# Patient Record
Sex: Female | Born: 1935 | Race: White | Hispanic: No | Smoking: Former smoker
Health system: Southern US, Community
[De-identification: ages and names within clinical notes are randomized; demographics above are authoritative.]

## PROBLEM LIST (undated history)

## (undated) DIAGNOSIS — F32A Depression, unspecified: Secondary | ICD-10-CM

## (undated) DIAGNOSIS — J302 Other seasonal allergic rhinitis: Secondary | ICD-10-CM

## (undated) DIAGNOSIS — J449 Chronic obstructive pulmonary disease, unspecified: Secondary | ICD-10-CM

## (undated) DIAGNOSIS — M810 Age-related osteoporosis without current pathological fracture: Secondary | ICD-10-CM

## (undated) DIAGNOSIS — G2581 Restless legs syndrome: Secondary | ICD-10-CM

## (undated) DIAGNOSIS — J45909 Unspecified asthma, uncomplicated: Secondary | ICD-10-CM

## (undated) DIAGNOSIS — F329 Major depressive disorder, single episode, unspecified: Secondary | ICD-10-CM

## (undated) DIAGNOSIS — E039 Hypothyroidism, unspecified: Secondary | ICD-10-CM

## (undated) DIAGNOSIS — I4891 Unspecified atrial fibrillation: Secondary | ICD-10-CM

## (undated) DIAGNOSIS — K219 Gastro-esophageal reflux disease without esophagitis: Secondary | ICD-10-CM

## (undated) DIAGNOSIS — E785 Hyperlipidemia, unspecified: Secondary | ICD-10-CM

## (undated) DIAGNOSIS — E0590 Thyrotoxicosis, unspecified without thyrotoxic crisis or storm: Secondary | ICD-10-CM

## (undated) HISTORY — DX: Hyperlipidemia, unspecified: E78.5

## (undated) HISTORY — DX: Hypothyroidism, unspecified: E03.9

## (undated) HISTORY — PX: TOTAL HIP ARTHROPLASTY: SHX124

## (undated) HISTORY — PX: TOTAL KNEE ARTHROPLASTY: SHX125

## (undated) HISTORY — PX: JOINT REPLACEMENT: SHX530

## (undated) HISTORY — DX: Gastro-esophageal reflux disease without esophagitis: K21.9

## (undated) HISTORY — DX: Other seasonal allergic rhinitis: J30.2

## (undated) HISTORY — DX: Depression, unspecified: F32.A

## (undated) HISTORY — DX: Major depressive disorder, single episode, unspecified: F32.9

## (undated) HISTORY — DX: Unspecified asthma, uncomplicated: J45.909

## (undated) HISTORY — DX: Age-related osteoporosis without current pathological fracture: M81.0

## (undated) HISTORY — DX: Restless legs syndrome: G25.81

## (undated) HISTORY — DX: Chronic obstructive pulmonary disease, unspecified: J44.9

---

## 1968-10-13 HISTORY — PX: THYROIDECTOMY: SHX17

## 1968-10-13 HISTORY — PX: BREAST CYST EXCISION: SHX579

## 1990-10-13 HISTORY — PX: VAGINAL HYSTERECTOMY: SHX2639

## 1993-10-13 HISTORY — PX: OTHER SURGICAL HISTORY: SHX169

## 2001-05-13 HISTORY — PX: BUNIONECTOMY: SHX129

## 2001-10-13 HISTORY — PX: OTHER SURGICAL HISTORY: SHX169

## 2005-08-29 ENCOUNTER — Ambulatory Visit: Payer: Self-pay | Admitting: Physical Medicine & Rehabilitation

## 2005-08-29 ENCOUNTER — Inpatient Hospital Stay (HOSPITAL_COMMUNITY): Admission: RE | Admit: 2005-08-29 | Discharge: 2005-09-02 | Payer: Self-pay | Admitting: Orthopedic Surgery

## 2006-07-01 ENCOUNTER — Inpatient Hospital Stay (HOSPITAL_COMMUNITY): Admission: RE | Admit: 2006-07-01 | Discharge: 2006-07-05 | Payer: Self-pay | Admitting: Orthopedic Surgery

## 2006-11-27 ENCOUNTER — Inpatient Hospital Stay (HOSPITAL_COMMUNITY): Admission: RE | Admit: 2006-11-27 | Discharge: 2006-11-30 | Payer: Self-pay | Admitting: Orthopedic Surgery

## 2010-02-06 ENCOUNTER — Encounter: Admission: RE | Admit: 2010-02-06 | Discharge: 2010-02-06 | Payer: Self-pay | Admitting: Family Medicine

## 2011-02-28 NOTE — Discharge Summary (Signed)
NAMECATLIN, DORIA               ACCOUNT NO.:  [id number]   MEDICAL RECORD NO.:  [id number]          PATIENT TYPE:  INP   LOCATION:  5022                         FACILITY:  [facility]   PHYSICIAN:  Harvie Junior, M.D.   DATE OF BIRTH:  [birth year]   DATE OF ADMISSION:  11/27/2006  DATE OF DISCHARGE:  11/30/2006                               DISCHARGE SUMMARY   ADMITTING DIAGNOSES:  1. End-stage degenerative joint disease, left knee.  2. Hypothyroidism.  3. Hypertension.  4. Status post left total hip replacement.  5. Status post right total knee replacement.   DISCHARGE DIAGNOSES:  1. End-stage degenerative joint disease, left knee.  2. Hypothyroidism.  3. Hypertension.  4. Status post left total hip replacement.  5. Status post right total knee replacement.  6. Discharge postop blood loss anemia.  7. Urinary tract infection.   ALLERGIES:  1. CODEINE.  2. MORPHINE.   PROCEDURES IN THE HOSPITAL:  Left total knee arthroplasty, computer  assisted, Jodi Geralds, MD, November 27, 2006.   BRIEF HISTORY:  [name] is an 77 year old patient who is well known  to [place] who has a long history of left knee pain.  She has pain with  weight bearing and pain with ambulation, as well as night pain.  X-rays  of the left shows bone on bone degenerative arthritis.  She only got  temporary relief with cortisone injections and based on her clinical and  radiographical findings, she is felt to be a candidate for a left total  knee arthroplasty and is admitted for this.   PERTINENT LABORATORY STUDIES:  Her EKG, on admission, showed normal  sinus rhythm.  Hemoglobin, on admission, was 10.0 and hematocrit 32.3.  On postop day number 1, her hemoglobin was 7.7.  Postop day number 2,  her hemoglobin was 7.0.  Postop day number 3, hemoglobin was 9.7 with a  hematocrit of 29.3.  Pro time, on admission, was 14.2 seconds with an  INR of 1.1.  On the day of discharge, on Coumadin, her pro time was 17.7  seconds with an INR of 1.4.  CMET, on admission, showed no abnormalities  other than a slightly decreased albumin at 3.1.  BMET, on postop day  number 1, was within normal limits, other than a slightly elevated  glucose at 138.  Urinalysis, on admission, showed many bacteria with  calcium oscillate crystals noted.  Two units of packed RBCs were  transfused during this hospitalization.   HOSPITAL COURSE:  Patient was brought to the operating room.  Preoperatively, she was given 80 mg of IV gentamicin and 1 gram of  Ancef.  Postoperatively, she was given 1 gram of Ancef q.8 hours x3  doses.  She was started on the PCA morphine pump for pain control and  was seen by physical therapy for walker, ambulation, weight bearing as  tolerated on the left.  CPM machine was used for range of motion of the  left knee, and she was started on Coumadin antithrombolytic therapy per  pharmacy protocol.  On postop day number 1, she overall was doing well.  Had appropriate left knee pain.  She did have a decreased hemoglobin, at  that point, of 7.7, but was not complaining of dizziness at all.  We got  her CBC the following morning.  On postop day number 2, she had vital  signs that were stable.  She had a hemoglobin of 7.0.  Her dressing was  changed to the left knee and her drain was discontinued.  She is to  continue with physical therapy and occupational therapy as well.  Two  units of packed RBCs were transfused.  Her IV was converted to a saline  lock.  On postop day number 3, she stated she was ready to go home.  She  is taking fluids and voiding without difficulty.  She is progressing  well with physical therapy.  She was afebrile and vital signs were  stable.  Her left knee wound was benign.  Her calf was soft and  neurovascularly intact distally.  Her hemoglobin was 9.7.  Her INR was  1.4.  She was discharged home after being seen by the therapist that  day.  She was instructed to ambulate, weight  bearing as tolerated on the  left with a walker.  She was improved condition, was on a regular diet.  She was given an Rx for Percocet 5 mg 1 q.6 hours p.r.n. pain and  Coumadin x1 month postop to take as directed.   ADDITIONAL MEDICATIONS AT THE TIME OF DISCHARGE:  Included:  1. Requip 0.5 mg 1 p.o. q.h.s.  2. Cymbalta 60 mg 1 daily.  3. Synthroid 125 mcg 1 daily.  4. Atenolol 100 mg p.o. daily.  5. Ventolin 90 mg b.i.d.  6. Astelin 30 nasal spray p.r.n.  7. Vitamin D 500 mg daily.  8. Calcium 1 tablet daily.   She will need home health physical therapy 3 times a week and home  health RN for pro time and Coumadin management and a home CPM machine  for range of motion.  She is to follow up with Dr. Luiz Blare in 10-14 days,  call sooner if any problems occur.      Marshia Ly, P.A.      Harvie Junior, M.D.  Electronically Signed    JB/MEDQ  D:  01/27/2007  T:  01/27/2007  Job:  [id number]

## 2011-02-28 NOTE — Discharge Summary (Signed)
[name], [name]               ACCOUNT NO.:  [id number]   MEDICAL RECORD NO.:  [id number]          PATIENT TYPE:  INP   LOCATION:  5004                         FACILITY:  [facility]   PHYSICIAN:  Harvie Junior, M.D.   DATE OF BIRTH:  1922/01/07   DATE OF ADMISSION:  08/29/2005  DATE OF DISCHARGE:  09/02/2005                                 DISCHARGE SUMMARY   ADMISSION DIAGNOSES:  1.  End-stage degenerative joint disease left hip.  2.  Hypertension.  3.  Hypothyroidism.  4.  Gastroesophageal reflux disease.  5.  History of tobacco usage, stopped eight years ago.   DISCHARGE DIAGNOSES:  1.  End-stage degenerative joint disease left hip.  2.  Hypertension.  3.  Hypothyroidism.  4.  Gastroesophageal reflux disease.  5.  History of tobacco usage, stopped eight years ago.  6.  Postoperative blood loss anemia.   ALLERGIES:  CODEINE.   PROCEDURES IN HOSPITAL:  Left total hip arthroplasty, Harvie Junior, M.D.,  on August 29, 2005.   BRIEF HISTORY:  Kelsey [name] is an 77 year old pleasant female who has a long  history of left hip pain and stiffness. Her pain has progressively worsened  to the point where she has night pain and pain with ambulation. She is  having difficulty ambulating because of hip pain and limited motion in her  left hip. Plain x-rays of the pelvis and left hip show that she has severe  bone-on-bone arthritis of the left hip. Based on her radiographic and  clinical findings, she is felt to be a candidate for a left total hip  arthroplasty and is admitted for this.   PERTINENT LABORATORY FINDINGS:  Postoperative x-ray of the left hip show  anatomic alignment, status post left hip arthroplasty without acute  complicating features. Hemoglobin on admission was 10.0, hematocrit 31.4,  white blood cell count 8.9, hemoglobin on postoperative day #1 was 7.9. Two  units of packed red blood cells were transfused and on postoperative day #2  her hemoglobin was 10.6, on  postoperative day #3 was 10.5. Indices within  normal limits. Protime on admission was 13.1 seconds, INR of 1.0, and PTT  29. On the day of discharge the patient's protime was 19.7 seconds with an  INR of 1.7, on Coumadin. CMET on admission showed no abnormalities other  than a slightly decreased albumin at 3.4.  On postoperative day #1 her  sodium was 134, glucose 141, calcium 8.2. Urinalysis on admission showed  rare bacteria with 7-10 wbc's per high power field.  Preoperative urine  culture showed 50,000 colonies, multiple bacterial morphotype present, none  predominant.   HOSPITAL COURSE:  The patient underwent a left total hip arthroplasty as  well described in Dr. Luiz Blare' operative note on August 29, 2005.  Preoperatively, she is given 80 mg of gentamycin and 1 gm of Ancef. She did  have a few white blood cells in her urine preoperatively and it was felt  that these antibiotics would take care of this. Surgery went without  complications as well as described in Dr. Luiz Blare' operative note.  Postoperatively  she was put on Ancef 1 gm IV q.8h. times five doses. She was  started on Coumadin therapy for DVT prophylaxis and IV pain medication. On  postoperative day #1, she is resting comfortably, afebrile, vital signs  stable, and her hemoglobin was 7.9.  She had a little bit dizziness. Her INR  was 1.1. Two units of packed RBCs were transfused. She has got out of bed  with physical therapy, weight bearing as tolerated on the left.  On  postoperative day #2, she was overall doing well, had a low grade fever of  100.8, and was then found to be afebrile. Her INR was 1.3.  Her hemoglobin  was up to 10.6.  Total hip precautions were instituted. Her PTCA was  discontinued and her IV was converted to a Hep lock. Her dressing was  changed. She had a little  nausea the following day. On postoperative day #3  she was without complaints. Hemoglobin was stable at 10.5, INR 1.4. She  continued with  home health physical therapy. On postoperative day #4, she  was doing well, she was ready to go home, she was taking p.o. and voiding  without difficulty. She was ambulating in the hall with therapy. Vital signs  were stable and afebrile. Her left hip wound had some mild serous drainage  with no sign of infection. Her INR was 1.7 and her hemoglobin was 10.5.  Dressing was changed on her left hip. She was discharged home in improved  condition. She was on a regular diet. She was given extra Vicodin 5 mg one  to two q.6h. p.r.n. pain, Coumadin times one month postoperative for DVT  prophylaxis, and Keflex 500 mg b.i.d. times five days. Activity status will  be weightbearing as tolerated on the left with a walker. She will need home  health Coumadin three times a week and home Coumadin management times one  month postoperatively. She will follow up with Dr. Luiz Blare in the office in  two weeks.      Kelsey [name], P.A.      Harvie Junior, M.D.  Electronically Signed    JB/MEDQ  D:  10/29/2005  T:  10/29/2005  Job:  [id number]

## 2011-02-28 NOTE — Op Note (Signed)
NAMEANANIAH, MACIOLEK               ACCOUNT NO.:  [id number]   MEDICAL RECORD NO.:  [id number]          PATIENT TYPE:  INP   LOCATION:  5011                         FACILITY:  [facility]   PHYSICIAN:  Harvie Junior, M.D.   DATE OF BIRTH:  Jan 20, 1922   DATE OF PROCEDURE:  07/01/2006  DATE OF DISCHARGE:  07/05/2006                                 OPERATIVE REPORT   DATE OF REPEAT LOST DICTATION:  July 15, 2006.   OPERATIVE PROCEDURES:  1. Right total knee replacement with Sigma system  2. Computer-assisted right total knee replacement.   SURGEON:  Harvie Junior, M.D.   Threasa HeadsOrma Flaming.   ANESTHESIA:  General.   BRIEF HISTORY:  Ms. Corter is an 77 year old female with long history of  having had left total hip replacement.  She has done with that.  Because of  continued complaints of unrelenting right knee pain with all failure of all  conservative care, she was taken to the operating room for a total knee  replacement.  The patient was noted to have some significant valgus  preoperative alignment.  It was felt that computer assistance would be  appropriate to try to get good neutral long alignment for her.  She was  taken to the operating room for these procedures.   PROCEDURE:  The patient was taken to the operating room.  After adequate  anesthesia was obtained with general endotracheal anesthesia, the patient  was placed supine on the operating room table.  The right leg was prepped  and draped in the usual sterile fashion.  Following this, a midline incision  was made.  Subcutaneous tissue dissected down to the level of the extensor  mechanism.  Medial parapatellar arthrotomy was undertaken and medial and  lateral meniscectomy followed by anterior and posterior cruciate excision  were undertaken, followed by excision of retropatellar fat pad.  At this  point the computer assistance modules were placed.  It takes about 20  minutes to get these in place and the  registration of the knee undertaken.  Following this, with computer assistance, the tibia was cut perpendicular to  its long axis.  The tensiometer was then used to balance the flexion-  extension and a plan was outlined for the case.  At that point, the distal  femur was cut perpendicular to its long axis under computer assistance, and  then the gaps were checked with spacer blocks.  At that point, the final  sizing was undertaken, and the final sizing was checked.  Anterior and  posterior cuts were then made.  The flexion gap was then checked to balance  the extension gap, and this was as per computer assistance, essentially dead  on.  Her long alignment had a significant valgus alignment initially but we  were able to release this prior to the tensioning of the case to allow [place] to  get excellent long alignment.  At this point, chamfer cuts were made, box  cuts made, and then the trial femur was put in place.  The tibia was  incised.  A punch for the  keel is made, and the trial size put in place.  The patella was then cut and a trial patella was put in place.  Excellent  range of motion, perfect neutral long alignment, perfect gap balancing was  achieved with the computer assistance.  At this point the knee was copiously  irrigated and suctioned dry.  The trial components were removed.  Pulsatile  lavage irrigation was used.  The final components were then cemented in  place.  Computer assistance modules were still in alignment, and again  showed that we did have perfect neutral long alignment, perfect gap  balancing.   At this point, after the cement had dried and all excess cement had been  removed, the medial parapatellar arthrotomy was closed with 1 Vicryl running  suture after placement of a single medium Hemovac drain.  Sterile  compressive dressing was then applied after closure of the skin with 0 and 2-  0 Vicryl and skin staples.  After the sterile compressive dressing was   applied, a knee immobilizer was placed, and patient was taken to the  recovery room.  She was noted to be in a satisfactory condition.   ESTIMATED BLOOD LOSS OF PROCEDURE:  Minimal _____.      Harvie Junior, M.D.  Electronically Signed     JLG/MEDQ  D:  07/15/2006  T:  07/16/2006  Job:  [id number]

## 2011-02-28 NOTE — Op Note (Signed)
NAMEMARESHA, Kelsey [name]               ACCOUNT NO.:  [id number]   MEDICAL RECORD NO.:  [id number]          PATIENT TYPE:  INP   LOCATION:  2899                         FACILITY:  [facility]   PHYSICIAN:  Harvie Junior, M.D.   DATE OF BIRTH:  1922/07/26   DATE OF PROCEDURE:  08/29/2005  DATE OF DISCHARGE:                                 OPERATIVE REPORT   PREOPERATIVE DIAGNOSIS:  Painful left hip with end-stage degenerative joint  disease.   POSTOPERATIVE DIAGNOSIS:  Painful left hip with end-stage degenerative joint  disease.   PROCEDURE:  Left total hip replacement with a Summit cemented stem, high  offset, size 5 with a +5 ball, a 54 mm Pinnacle cup with a 32 mm poly 10  degree hooded liner.   SURGEON:  Harvie Junior, M.D.   ASSISTANT:  Marshia Ly, P.A.   ANESTHESIA:  General anesthesia.   BRIEF HISTORY:  Ms. [name] is an 77 year old long female with a long  history of having had significant left hip pain.  She ultimately had been  evaluated in our office multiple times and x-ray sequentially showed the  absolute loss of left joint space and she was having significant groin pain  and lateral hip pain.  She basically was requesting a total hip replacement  on evaluation and because of her x-rays that showed this degenerative  change, we felt this was the most appropriate course of action, she was  brought to the operating room for total hip replacement.   DESCRIPTION OF PROCEDURE:  Patient brought to the operating room and after  adequate anesthesia was obtained under general anesthetic, the patient was  brought to the operating room and placed on the operating table.  Left leg  was prepped and draped in usual sterile fashion.  Following this, the  incision was made for posterior approach to the hip.  Subcutaneous tissue  dissected down to the level of the tensor fascia which was divided in line  with its fibers.  The gluteus maximus was finger fractured.  The retractor  was  then put in place underneath the piriformis and inferiorly around the  neck.  The piriformis was then tagged.  The posterior capsule was then  tagged with two Ethibond interrupted sutures and the hip was then  dislocated.  Provisional neck cut was made at this point with a guide.  Following this, the canal was reamed and then broached to a size 5.  The  size 5 broach did go down well but there was some tightness getting it down  impaled to push this.  We trialed with a size 5 broach.  Original plan was a  6 which is 2 mm longer than a 5.  We thought we had a good low neck cut so  we trialed the +0 ball initially with a high offset stem and then felt that  the probably +5 gave [place] greater stability and then given her age and issues,  I think that stability was more important so we used a +5 ball.  At this  point, trial range of motion showed  excellent stability and range of motion.  The wound was then copiously irrigated and suctioned dry.  The trial was  removed.  The final cup was placed into an acetabulum which had been  sequentially reamed to a level of 53 and a 54 cut was placed.  The whole  eliminator was now used and the trial poly was removed and the final poly  put in place.  The stem was then cemented into place and once the cement was  allowed to harden, trial reductions were undertaken with the +0 and +5 ball.  Both seemed reasonable but it just felt like we got a little bit of extra  stability lack of shuck with a +5 ball and we felt that even though there is  some potential issues with leg length that stability would be the more  important issue as we discussed with the patient preoperatively and at this  point, when this was chosen, the final ball was put in place.  Final  reduction was undertaken.  The short external rotators and posterior capsule  were repaired posteriorly and this allowed excellent repair posteriorly.  At  this point, hip was copiously irrigated, suctioned  dry.  The tensor fascia  was closed with +1 Vicryl running suture, the skin with 0 and 2-0 Vicryl and  then skin staples.  A sterile compressive dressing was applied and the  patient taken to the recovery room where she was noted to be in satisfactory  condition.   ESTIMATED BLOOD LOSS:  About 100 mL.      Harvie Junior, M.D.  Electronically Signed     JLG/MEDQ  D:  08/29/2005  T:  08/30/2005  Job:  [id number]

## 2011-02-28 NOTE — Discharge Summary (Signed)
Kelsey [name], Kelsey [name]               ACCOUNT NO.:  [id number]   MEDICAL RECORD NO.:  [id number]          PATIENT TYPE:  INP   LOCATION:  5011                         FACILITY:  [facility]   PHYSICIAN:  Harvie Junior, M.D.   DATE OF BIRTH:  05-31-1922   DATE OF ADMISSION:  07/01/2006  DATE OF DISCHARGE:  07/05/2006                                 DISCHARGE SUMMARY   ADMISSION DIAGNOSES:  1. End-stage degenerative joint disease, right knee.  2. Hypertension.  3. Chronic obstructive pulmonary disease.  4. Status post left total hip arthroplasty, November 2006.  5. History of tobacco dependency in the remote past.   DISCHARGE DIAGNOSES:  1. End-stage degenerative joint disease, right knee.  2. Hypertension.  3. Chronic obstructive pulmonary disease.  4. Status post left total hip arthroplasty, November 2006.  5. History of tobacco dependency in the remote past.  6. Hypothyroidism.  7. Esophageal reflux.  8. Cardiomegaly.   PROCEDURES IN HOSPITAL:  Right total knee arthroplasty, computer assisted,  Jodi Geralds, M.D., July 01, 2006.   BRIEF HISTORY:  Kelsey [name] is an 77 year old patient who is well known to  [place] with a long history of right knee pain.  She has night pain, and pain on  ambulation.  Her x-rays showed bone-on-bone degenerative arthritis of the  right knee.  She did not improve with exhaustive conservative treatment, and  was admitted for a right total knee replacement.   PERTINENT LABORATORY STUDIES:  Hemoglobin on admission was 14.1, hematocrit  42.3.  Postoperative day number 1, hemoglobin 7.4; number 2, 8.8; number 3,  9.0.  Pro time on admission was 13 seconds, with an INR 1.0, PTT was 30.  On  the date of discharge, her pro time was 26.6 seconds, with an INR of 2.3 on  Coumadin.  CMET on admission was essentially within normal limits.  BMET on  postoperative day number 1 and 2 was also within normal limits.  Urinalysis  showed no abnormalities.   HOSPITAL  COURSE:  The patient underwent right total knee replacement,  computer assisted, as was described in Dr. Luiz Blare operative note on  July 01, 2006.  She was put on IV antibiotics preoperatively as well as  postoperatively.  This being Ancef 1 gram and gentamycin given  preoperatively.  She was also started on Coumadin per pharmacy protocol for  DVT prophylaxis the night of surgery.  CPM machine was used for right knee  range of motion, and physical therapy was ordered for walker ambulation,  weightbearing as tolerated on the right.  On postoperative day number 1, she  had moderate knee pain.  Her hemoglobin was stable at 10.4.  INR was 1.1.  She was alert and oriented.  She had a Hemovac drain which was intact; and a  Foley catheter had been placed at the time of the surgery, and was intact.  She has gotten out of bed to the chair with physical therapy.  She ambulated  with a rolling walker.   On postoperative day number 2, she had some muscle spasms in her legs.  She  was taking fluids without difficulty.  Her Foley catheter had been removed.  Her hemoglobin was 8.8, and her INR was 1.9.  Her dressing was changed, and  her Hemovac drain was pulled.  Her PCA Dilaudid was discontinued.  Her IV  was converted to a saline lock.  She was given Robaxin for her leg spasm.   On postoperative day number 3, she was alert and oriented.  She had some  muscle spasms, but her pain was well controlled.  Her hemoglobin was 9.  The  patient was continued with physical therapy.  On 07/05/06, postoperative day  number 4, the patient was comfortable.  She was ready to go home.  She had  no shortness of breath or chest pain.  Her vitals signs were stable.  She  was afebrile.  The incision was clean and dry, and appears intact distally.  She was discharged home in improved condition.  She will be weightbearing as  tolerated on the right, ambulating with a walker.  She will use CPM per  protocol.  She was  given a prescription for Vicodin p.r.n. for pain,  Coumadin per pharmacy protocol x1 month postoperative, and Robaxin 500 mg  p.r.n. spasm.  She will be on a regular diet.  She will continue to wear her  knee-high TED hose on both lower extremities.  She will follow up with Dr.  Luiz Blare in two weeks in the office.      Marshia Ly, P.A.      Harvie Junior, M.D.  Electronically Signed    JB/MEDQ  D:  08/19/2006  T:  08/20/2006  Job:  [id number]

## 2011-02-28 NOTE — Op Note (Signed)
NAMESHWETA, AMAN               ACCOUNT NO.:  [id number]   MEDICAL RECORD NO.:  [id number]          PATIENT TYPE:  INP   LOCATION:  5022                         FACILITY:  [facility]   PHYSICIAN:  Harvie Junior, M.D.   DATE OF BIRTH:  December 14, 1921   DATE OF PROCEDURE:  11/27/2006  DATE OF DISCHARGE:                               OPERATIVE REPORT   PREOPERATIVE DIAGNOSES:  End-stage degenerative joint disease, left  knee, with significant valgus alignment.   POSTOPERATIVE DIAGNOSES:  End-stage degenerative joint disease, left  knee, with significant valgus alignment.   PRINCIPAL PROCEDURE:  1. Left total knee replacement with Sigma System size 3 femur, size 3      tibia, 10-mm bridging bearing, and a 35-mm all polyethylene      patella.  2. Computer-assisted total knee replacement, left.   SURGEON:  Jodi Geralds, MD   ASSISTANT:  Marshia Ly, PA   ANESTHESIA:  General.   BRIEF HISTORY:  Ms. Klinger is an 77 year old female with a long history  of having had severe bilateral knee disease.  She also had bilateral hip  disease.  She had a left total hip replacement.  She had a right total  knee replacement and was coming now for left total knee replacement  because of severe unrelenting disease.  She had a severe valgus  alignment preoperatively and because of a hip replacement, we felt that  most appropriate here was going to be computer-assisted alignment to try  to get her into neutral long alignment and this was preplanned and  brought into the operating room with [place].   PROCEDURE:  The patient was brought to the operating room and after  adequate anesthesia was obtained under general anesthesia, the patient  was placed supine on the operating table.  The left knee was then  prepped and draped in the usual sterile fashion.  The curved incision  was made to follow her valgus malalignment and subcutaneous tissue was  taken down to the level of the extensor mechanism.  A  medial  parapatellar arthrotomy was undertaken as well as a medial and lateral  meniscectomy, posterior and anterior cruciate excision, and  retropatellar fat pad excision.  At this point, attention was turned  towards placement of the computer modules, two pins in the tibia, two  pins in the femur, and then the rays were placed.  Registration process  undertaken.  It takes about twenty minutes to do all of this.  Once this  registration process was undertaken, we basically looked at her long  alignment valgus.  She was in seven degrees of valgus alignment and we  did lateral releases of the iliotibial band and the posterolateral  capsular structures.  Once this was completed, attention was turned  toward the computer access to cut the tibia perpendicular to its long  access.  Tensiometer was then placed.  We came to the planning screen.  The planning screen was moving the joint line and not having balanced  gaps in the way we felt was appropriate.  At this point, we cut more  tibia; two  more mm of tibia were cut and then I rebalanced with the  computer assistance at that point and we got an excellent plan to  balance the gaps in flexion and extension with essentially a neutral  joint line.  At this point, attention was turned towards cutting the  distal femur.  This was cut perpendicular to the long axis of the leg  and once this was completed, I used spacer blocks just to check the gaps  and the gaps felt perfect with perfect neutral long alignment.  At this  point, a size 3 femur had been chosen.  The anterior and posterior cuts  were made as well as the chamfer cuts and then the box was cut.  The  tibia was then sized, keeled, and drilled for a size 3 tibia.  The 10-mm  bridging bearing was then put in place and with a perfect neutral long  alignment, perfect gap balance, and perfect rotation with no limits to  knee flexion at this point.  The patella was then cut with a guide and   the patellar button was chosen, 35-mm all polyethylene patella, and the  trial was put in place after the lugs were drilled for this and  excellent long alignment, no instability.  At this point, all of the  trials were removed.  The knee was copiously and thoroughly irrigated  and suctioned dry.  The final components were then opened, identified,  and after cement was mixed, these were all cemented into place, size 3  femur, size 3 tibia, 10-mm bridging bearing trial was not opened, and a  35-mm patella was cemented into place.  We put the trial implant in  place and allowed the cement to harden.  Then we looked to make sure  there was no cement in the back.  There was not.  Then we let the  tourniquet down to make sure there was no significant bleeding.  None  was encountered that was not controllable with electrocautery.  At this  point, the final polyethylene was put in place and then the knee was  begun to be put through a range of motion with excellent stability.  Medium Hemovac drain was placed.  Medial parapatellar arthrotomy was  closed with a #1 Vicryl running suture, skin with #0 and 2-0 Vicryl and  skin staples.  Sterile compressive dressing was applied.  The patient  was taken to the recovery room where she was noted to be in satisfactory  condition.   ESTIMATED BLOOD LOSS:  For procedure was less than 50 mL when the  tourniquet was let down.      Harvie Junior, M.D.  Electronically Signed     JLG/MEDQ  D:  11/27/2006  T:  11/28/2006  Job:  [id number]

## 2011-12-15 ENCOUNTER — Other Ambulatory Visit: Payer: Self-pay | Admitting: Cardiology

## 2011-12-15 NOTE — H&P (Signed)
Office Visit     Patient: Kelsey [name], Kelsey [name] Provider: Donato Schultz, MD  DOB: [birth year] Age: 78 Y Sex: Female Date: 12/15/2011  Phone: [phone]   Address: [address] [place], [place]  Pcp: ELAINE GRIFFIN       Subjective:     CC:    1. FOLLOW UP & SEE JEREMY.        HPI:  General:  78 year old with atrial fibrillation on chronic anticoagulation with prior history of heart failure, COPD, hypertension, hyperlipidemia here for follow up evaluation of atrial fibrillation and heart failure.   An echocardiogram performed on 10/12 demonstrated EF of 55% with moderate to severe tricuspid regurgitation, PA pressure 45 mm mercury, moderately enlarged left atrial size with mild to moderate mitral regurgitation and mild circumferential pericardial effusion. In [facility], noted CHF. Could not breath, Edema. Hospital stay. Now stable. Afib also. Cardiologist up there wanted DC CV. But, INR was not stable. BP was elevated during hospitalization. No prior CVA, no DM, +HTN, +diastolic HF. Coumadin since October.  Discussed at length the locations of cardioversion. The rationale behind it. With her prior diastolic heart failure in the setting of atrial fibrillation and the fact that this is new onset atrial fibrillation, it does make sense to try sinus rhythm. Fortunately, she does feel well currently. She does not feel any palpitations. Her daughter was present.  On 12/15/11, she has had 5 weeks of therapeutic INRs. She still occasionally will feel dyspnea on exertion while out ambulating. She does have anxiety. .        ROS:  Denies any bleeding, syncope. Mild to moderate shortness of breath at times. No angina. The other elements of the review of systems are negative.       Medical History: Hypothyroidism, GERD, Osteoporosis, Hyperlipidemia, Osteoarthritis, Hypertension, COPD, Depression, Allergies, Restless leg syndrome, CHF, Afib.        Family History: Non-Contributory No  oral a family history of CAD.       Social History:  General:  History of smoking  cigarettes: Former smoker Smoking: quit, 50-pack-year history.  no Alcohol.  Caffeine: yes.  Diet: No greens, low vitamin K for warfarin control.  Occupation: unemployed, Retired, worked at [employer] Go back and forth from [place].  Marital Status: single, widowed.  Children: Boys, 1, girls, 2.  Seat belt use: yes.        Medications: Simvastatin 5 MG Tablet 1 tablet Once a day, Tylenol 500 mg Tablet 1 tablet as needed every 6 hrs, Vitamin D3 2000 UNIT Capsule as directed , Levothyroxine Sodium 25 mcg Tablet 1 tablet every morning on an empty stomach Once a day, Omeprazole 20 MG Capsule Delayed Release 1 capsule Once a day, Fexofenadine HCl 180 MG Tablet 1 tablet Once a day, Baby Aspirin 81 MG Tablet Chewable 1 tablet Once a day, Albuterol Sulfate (2.5 MG/3ML) 0.083% Nebulization Solution 3 ml as needed every 4 hrs, Fluoxetine HCl 20 MG Capsule 1 capsule in the morning Once a day, Atrovent HFA 17 MCG/ACT Aerosol Solution 2 puffs Four times a day, Furosemide 20 MG Tablet 1 tablet Once a day, Tylenol PM Extra Strength 500-25 MG Tablet 1 tablet at bedtime as needed Once a day, Metoprolol Succinate 100 MG Tablet Extended Release 24 Hour 1 tablet Twice a day, Senokot S 8.6-50 MG Tablet 1 tablet in the evening as needed Once a day, Diltiazem HCl 240 MG Capsule Extended Release 24 Hour 1 capsule every morning on an empty stomach Once  a day, Ferrous Sulfate 325 (65 Fe) MG Tablet 1 tablet Daily, Enalapril Maleate 5 MG Tablet 1 tablet once a day, Warfarin Sodium 5 MG Tablet per pharmD 5 mg qd except 7.5 mg M/W, Medication List reviewed and reconciled with the patient       Allergies: codeine, Morphine Sulfate, Phenergan, Ativan: excessive somnolence: Side Effects.       Objective:     Vitals: Wt 149, Ht 62.5, BMI 26.82, Pulse sitting 84, BP sitting 130/90.       Examination:  General Examination:  GENERAL  APPEARANCE alert, oriented, NAD, pleasant.  SKIN: normal, no rash.  HEENT: normal.  HEAD: [place]/AT.  EYES: EOMI, Conjunctiva clear.  NECK: supple, FROM, without evidence of thyromegaly, adenopathy, or bruits, no jugular venous distention (JVD).  LUNGS: clear to auscultation bilaterally, no wheezes, rhonchi, rales, regular breathing rate and effort.  HEART: irregular irregular, no S3, S4, murmur or rub, point of maximul impulse (PMI) normal.  ABDOMEN: soft, non-tender/non-distended, bowel sounds present, no masses palpated, no bruit.  EXTREMITIES: no clubbing, no edema, pulses 2 plus bilaterally.  NEUROLOGIC EXAM: non-focal exam, alert and oriented x 3.  PERIPHERAL PULSES: normal (2+) bilaterally.  LYMPH NODES: no cervical adenopathy.  PSYCH affect normal.        Assessment:     Assessment:  1. Afib - 427.31 (Primary)  2. COPD, nos - 496  3. CHF - 428.0, Normal ejection fraction  4. Anticoagulant monitoring - V58.61    Plan:     1. Afib  LAB: CBC with Diff    WBC 9.3 4.0-11.0 - K/ul    RBC 4.49 4.20-5.40 - M/uL    HGB 13.9 12.0-16.0 - g/dL    HCT 16.1 09.6-04.5 - %    MCH 30.9 27.0-33.0 - pg    MPV 7.8 7.5-10.7 - fL    MCV 92.7 81.0-99.0 - fL    MCHC 33.3 32.0-36.0 - g/dL    RDW 40.9 81.1-91.4 - %    PLT 219 150-400 - K/uL    NEUT % 73.0 43.3-71.9 - % H   LYMPH% 19.4 16.8-43.5 - %    MONO % 5.7 4.6-12.4 - %    EOS % 1.4 0.0-7.8 - %    BASO % 0.5 0.0-1.0 - %    NEUT # 6.9 1.9-7.2 - K/uL    LYMPH# 1.80 1.10-2.70 - K/uL    MONO # 0.5 0.3-0.8 - K/uL    EOS # 0.1 0.0-0.6 - K/uL    BASO # 0.0 0.0-0.1 - K/uL     LAB: Basic Metabolic    GLUCOSE 90  70-99 - mg/dL    BUN 25  7-82 - mg/dL    CREATININE 9.56  2.13-0.86 - mg/dl    eGFR (NON-AFRICAN AMERICAN) 65 >60 - calc    eGFR (AFRICAN AMERICAN) 79 >60 - calc    SODIUM 140  136-145 - mmol/L    POTASSIUM 3.9  3.5-5.5 - mmol/L    CHLORIDE 104  98-107 - mmol/L    C02 31  22-32 - mg/dL    ANION GAP 8.6 5.7-84.6 - mmol/L      CALCIUM 9.3  8.6-10.3 - mg/dL     She is still in atrial fibrillation. I would like to give her a chance to sinus rhythm to see how this helps with her overall dyspnea. We have discussed the possibility of continuing rate control versus rhythm control. She understands the risks of cardioversion including stroke, adverse arrhythmia. Alternatives treatments have  been discussed. All questions have been answered. She is willing to proceed.       2. COPD, nos  Likely playing a role as well and dyspnea.       3. CHF  Diastolic heart failure. Mild degree of pulmonary hypertension.       4. Anticoagulant monitoring  [employer], [address].D. checked her Coumadin today. It was therapeutic.        Immunizations:        Labs:        Procedure Codes: 40347 ECL CBC PLATELET DIFF, 80048 ECL BMP, 42595 BLOOD COLLECTION ROUTINE VENIPUNCTURE       Preventive:         Follow Up: 2 Weeks post cardioversion with Aram Beecham      Provider: Donato Schultz, MD  Patient: Kelsey [name], Kelsey [name] DOB: 1922-03-24 Date: 12/15/2011

## 2011-12-16 ENCOUNTER — Other Ambulatory Visit: Payer: Self-pay

## 2011-12-16 ENCOUNTER — Ambulatory Visit (HOSPITAL_COMMUNITY)
Admission: RE | Admit: 2011-12-16 | Discharge: 2011-12-16 | Disposition: A | Discharge status: Home / Self Care | Payer: Medicare Other | Source: Ambulatory Visit | Attending: Cardiology | Admitting: Cardiology

## 2011-12-16 ENCOUNTER — Encounter (HOSPITAL_COMMUNITY): Payer: Self-pay | Admitting: *Deleted

## 2011-12-16 ENCOUNTER — Encounter (HOSPITAL_COMMUNITY)
Admission: RE | Disposition: A | Discharge status: Home / Self Care | Payer: Self-pay | Source: Ambulatory Visit | Attending: Cardiology

## 2011-12-16 ENCOUNTER — Ambulatory Visit (HOSPITAL_COMMUNITY): Payer: Medicare Other | Admitting: *Deleted

## 2011-12-16 DIAGNOSIS — Z7901 Long term (current) use of anticoagulants: Secondary | ICD-10-CM | POA: Insufficient documentation

## 2011-12-16 DIAGNOSIS — I10 Essential (primary) hypertension: Secondary | ICD-10-CM | POA: Insufficient documentation

## 2011-12-16 DIAGNOSIS — I4891 Unspecified atrial fibrillation: Secondary | ICD-10-CM | POA: Insufficient documentation

## 2011-12-16 DIAGNOSIS — I509 Heart failure, unspecified: Secondary | ICD-10-CM | POA: Insufficient documentation

## 2011-12-16 DIAGNOSIS — J4489 Other specified chronic obstructive pulmonary disease: Secondary | ICD-10-CM | POA: Insufficient documentation

## 2011-12-16 DIAGNOSIS — J449 Chronic obstructive pulmonary disease, unspecified: Secondary | ICD-10-CM | POA: Insufficient documentation

## 2011-12-16 DIAGNOSIS — E785 Hyperlipidemia, unspecified: Secondary | ICD-10-CM | POA: Insufficient documentation

## 2011-12-16 HISTORY — PX: CARDIOVERSION: SHX1299

## 2011-12-16 SURGERY — CARDIOVERSION
Anesthesia: General | Wound class: Clean

## 2011-12-16 MED ORDER — SODIUM CHLORIDE 0.9 % IV SOLN
250.0000 mL | INTRAVENOUS | Status: DC
  Administered 2011-12-16: 1000 mL via INTRAVENOUS

## 2011-12-16 MED ORDER — SODIUM CHLORIDE 0.9 % IJ SOLN
3.0000 mL | Freq: Two times a day (BID) | INTRAMUSCULAR | Status: DC
  Administered 2011-12-16: 3 mL via INTRAVENOUS

## 2011-12-16 MED ORDER — SODIUM CHLORIDE 0.9 % IJ SOLN: 3.0000 mL | INTRAMUSCULAR | Status: DC | PRN

## 2011-12-16 MED ORDER — LIDOCAINE HCL (CARDIAC) 10 MG/ML IV SOLN
INTRAVENOUS | Status: DC | PRN
  Administered 2011-12-16: 10 mg via INTRAVENOUS

## 2011-12-16 MED ORDER — PROPOFOL 10 MG/ML IV EMUL
INTRAVENOUS | Status: DC | PRN
  Administered 2011-12-16: 30 mg via INTRAVENOUS

## 2011-12-16 MED ORDER — SODIUM CHLORIDE 0.9 % IV SOLN
INTRAVENOUS | Status: DC | PRN
  Administered 2011-12-16: 11:00:00 via INTRAVENOUS

## 2011-12-16 MED ORDER — HYDROCORTISONE 1 % EX CREA: 1.0000 "application " | TOPICAL_CREAM | Freq: Three times a day (TID) | CUTANEOUS | Status: DC | PRN

## 2011-12-16 NOTE — Transfer of Care (Signed)
Immediate Anesthesia Transfer of Care Note  Patient: Kelsey [name]  Procedure(s) Performed: Procedure(s) (LRB): CARDIOVERSION (N/A)  Patient Location: Short Stay  Anesthesia Type: General  Level of Consciousness: awake, oriented and patient cooperative  Airway & Oxygen Therapy: Patient Spontanous Breathing and Patient connected to nasal cannula oxygen  Post-op Assessment: Report given to PACU RN and Post -op Vital signs reviewed and stable  Post vital signs: Reviewed  Complications: No apparent anesthesia complications

## 2011-12-16 NOTE — Anesthesia Preprocedure Evaluation (Signed)
Anesthesia Evaluation  Patient identified by MRN, date of birth, ID band Patient awake    Reviewed: Allergy & Precautions, H&P , NPO status , Patient's Chart, lab work & pertinent test results, reviewed documented beta blocker date and time   Airway       Dental   Pulmonary shortness of breath,          Cardiovascular hypertension, Pt. on home beta blockers and Pt. on medications + dysrhythmias Atrial Fibrillation     Neuro/Psych    GI/Hepatic Neg liver ROS, GERD-  Medicated and Controlled,  Endo/Other  negative endocrine ROS  Renal/GU negative Renal ROS     Musculoskeletal negative musculoskeletal ROS (+)   Abdominal   Peds  Hematology negative hematology ROS (+)   Anesthesia Other Findings   Reproductive/Obstetrics negative OB ROS                           Anesthesia Physical Anesthesia Plan  ASA: III  Anesthesia Plan: General   Post-op Pain Management:    Induction: Intravenous  Airway Management Planned: Mask  Additional Equipment:   Intra-op Plan:   Post-operative Plan:   Informed Consent: I have reviewed the patients History and Physical, chart, labs and discussed the procedure including the risks, benefits and alternatives for the proposed anesthesia with the patient or authorized representative who has indicated his/her understanding and acceptance.   Dental advisory given  Plan Discussed with: Anesthesiologist and Surgeon  Anesthesia Plan Comments:         Anesthesia Quick Evaluation

## 2011-12-16 NOTE — H&P (View-Only) (Signed)
Office Visit     Patient: Kelsey [name], [name] Provider: Deloma Spindle, MD  DOB: 07/05/1922 Age: 78 Y Sex: Female Date: 12/15/2011  Phone: [phone]   Address: [address], [place], [place]  Pcp: ELAINE GRIFFIN       Subjective:     CC:    1. 1MO FOLLOW UP & SEE JEREMY.        HPI:  General:  78-year-old with atrial fibrillation on chronic anticoagulation with prior history of heart failure, COPD, hypertension, hyperlipidemia here for follow up evaluation of atrial fibrillation and heart failure.   An echocardiogram performed on 10/12 demonstrated EF of 55% with moderate to severe tricuspid regurgitation, PA pressure 45 mm mercury, moderately enlarged left atrial size with mild to moderate mitral regurgitation and mild circumferential pericardial effusion. In [place], noted CHF. Could not breath, Edema. Hospital stay. Now stable. Afib also. Cardiologist up there wanted DC CV. But, INR was not stable. BP was elevated during hospitalization. No prior CVA, no DM, +HTN, +diastolic HF. Coumadin since October.  Discussed at length the locations of cardioversion. The rationale behind it. With her prior diastolic heart failure in the setting of atrial fibrillation and the fact that this is new onset atrial fibrillation, it does make sense to try sinus rhythm. Fortunately, she does feel well currently. She does not feel any palpitations. Her daughter was present.  On 12/15/11, she has had 5 weeks of therapeutic INRs. She still occasionally will feel dyspnea on exertion while out ambulating. She does have anxiety. .        ROS:  Denies any bleeding, syncope. Mild to moderate shortness of breath at times. No angina. The other elements of the review of systems are negative.       Medical History: Hypothyroidism, GERD, Osteoporosis, Hyperlipidemia, Osteoarthritis, Hypertension, COPD, Depression, Allergies, Restless leg syndrome, CHF, Afib.        Family History: Non-Contributory No  oral a family history of CAD.       Social History:  General:  History of smoking  cigarettes: Former smoker Smoking: quit, 50-pack-year history.  no Alcohol.  Caffeine: yes.  Diet: No greens, low vitamin K for warfarin control.  Occupation: unemployed, Retired, worked at [employer]. Go back and forth from [place].  Marital Status: single, widowed.  Children: Boys, 1, girls, 2.  Seat belt use: yes.        Medications: Simvastatin 5 MG Tablet 1 tablet Once a day, Tylenol 500 mg Tablet 1 tablet as needed every 6 hrs, Vitamin D3 2000 UNIT Capsule as directed , Levothyroxine Sodium 25 mcg Tablet 1 tablet every morning on an empty stomach Once a day, Omeprazole 20 MG Capsule Delayed Release 1 capsule Once a day, Fexofenadine HCl 180 MG Tablet 1 tablet Once a day, Baby Aspirin 81 MG Tablet Chewable 1 tablet Once a day, Albuterol Sulfate (2.5 MG/3ML) 0.083% Nebulization Solution 3 ml as needed every 4 hrs, Fluoxetine HCl 20 MG Capsule 1 capsule in the morning Once a day, Atrovent HFA 17 MCG/ACT Aerosol Solution 2 puffs Four times a day, Furosemide 20 MG Tablet 1 tablet Once a day, Tylenol PM Extra Strength 500-25 MG Tablet 1 tablet at bedtime as needed Once a day, Metoprolol Succinate 100 MG Tablet Extended Release 24 Hour 1 tablet Twice a day, Senokot S 8.6-50 MG Tablet 1 tablet in the evening as needed Once a day, Diltiazem HCl 240 MG Capsule Extended Release 24 Hour 1 capsule every morning on an empty stomach Once   a day, Ferrous Sulfate 325 (65 Fe) MG Tablet 1 tablet Daily, Enalapril Maleate 5 MG Tablet 1 tablet once a day, Warfarin Sodium 5 MG Tablet per pharmD 5 mg qd except 7.5 mg M/W, Medication List reviewed and reconciled with the patient       Allergies: codeine, Morphine Sulfate, Phenergan, Ativan: excessive somnolence: Side Effects.       Objective:     Vitals: Wt 149, Ht 62.5, BMI 26.82, Pulse sitting 84, BP sitting 130/90.       Examination:  General Examination:  GENERAL  APPEARANCE alert, oriented, NAD, pleasant.  SKIN: normal, no rash.  HEENT: normal.  HEAD: [place]/AT.  EYES: EOMI, Conjunctiva clear.  NECK: supple, FROM, without evidence of thyromegaly, adenopathy, or bruits, no jugular venous distention (JVD).  LUNGS: clear to auscultation bilaterally, no wheezes, rhonchi, rales, regular breathing rate and effort.  HEART: irregular irregular, no S3, S4, murmur or rub, point of maximul impulse (PMI) normal.  ABDOMEN: soft, non-tender/non-distended, bowel sounds present, no masses palpated, no bruit.  EXTREMITIES: no clubbing, no edema, pulses 2 plus bilaterally.  NEUROLOGIC EXAM: non-focal exam, alert and oriented x 3.  PERIPHERAL PULSES: normal (2+) bilaterally.  LYMPH NODES: no cervical adenopathy.  PSYCH affect normal.        Assessment:     Assessment:  1. Afib - 427.31 (Primary)  2. COPD, nos - 496  3. CHF - 428.0, Normal ejection fraction  4. Anticoagulant monitoring - V58.61    Plan:     1. Afib  LAB: CBC with Diff    WBC 9.3 4.0-11.0 - K/ul    RBC 4.49 4.20-5.40 - M/uL    HGB 13.9 12.0-16.0 - g/dL    HCT 41.7 37.0-47.0 - %    MCH 30.9 27.0-33.0 - pg    MPV 7.8 7.5-10.7 - fL    MCV 92.7 81.0-99.0 - fL    MCHC 33.3 32.0-36.0 - g/dL    RDW 13.7 11.5-15.5 - %    PLT 219 150-400 - K/uL    NEUT % 73.0 43.3-71.9 - % H   LYMPH% 19.4 16.8-43.5 - %    MONO % 5.7 4.6-12.4 - %    EOS % 1.4 0.0-7.8 - %    BASO % 0.5 0.0-1.0 - %    NEUT # 6.9 1.9-7.2 - K/uL    LYMPH# 1.80 1.10-2.70 - K/uL    MONO # 0.5 0.3-0.8 - K/uL    EOS # 0.1 0.0-0.6 - K/uL    BASO # 0.0 0.0-0.1 - K/uL     LAB: Basic Metabolic    GLUCOSE 90  70-99 - mg/dL    BUN 25  6-26 - mg/dL    CREATININE 0.83  0.60-1.30 - mg/dl    eGFR (NON-AFRICAN AMERICAN) 65 >60 - calc    eGFR (AFRICAN AMERICAN) 79 >60 - calc    SODIUM 140  136-145 - mmol/L    POTASSIUM 3.9  3.5-5.5 - mmol/L    CHLORIDE 104  98-107 - mmol/L    C02 31  22-32 - mg/dL    ANION GAP 8.6 6.0-20.0 - mmol/L      CALCIUM 9.3  8.6-10.3 - mg/dL     She is still in atrial fibrillation. I would like to give her a chance to sinus rhythm to see how this helps with her overall dyspnea. We have discussed the possibility of continuing rate control versus rhythm control. She understands the risks of cardioversion including stroke, adverse arrhythmia. Alternatives treatments have   been discussed. All questions have been answered. She is willing to proceed.       2. COPD, nos  Likely playing a role as well and dyspnea.       3. CHF  Diastolic heart failure. Mild degree of pulmonary hypertension.       4. Anticoagulant monitoring  Jeremy Smart, Pharm.D. checked her Coumadin today. It was therapeutic.        Immunizations:        Labs:        Procedure Codes: 85025 ECL CBC PLATELET DIFF, 80048 ECL BMP, 36415 BLOOD COLLECTION ROUTINE VENIPUNCTURE       Preventive:         Follow Up: 2 Weeks post cardioversion with Cynthia      Provider: Daimion Adamcik, MD  Patient: Kelsey [name], [name] DOB: 08/02/1922 Date: 12/15/2011     

## 2011-12-16 NOTE — Anesthesia Postprocedure Evaluation (Signed)
Anesthesia Post Note  Patient: Kelsey [name]  Procedure(s) Performed: Procedure(s) (LRB): CARDIOVERSION (N/A)  Anesthesia type: general  Patient location: PACU  Post pain: Pain level controlled  Post assessment: Patient's Cardiovascular Status Stable  Last Vitals:  Filed Vitals:   12/16/11 1300  BP: 127/62  Pulse: 53  Temp:   Resp: 18    Post vital signs: Reviewed and stable  Level of consciousness: sedated  Complications: No apparent anesthesia complications

## 2011-12-16 NOTE — Preoperative (Signed)
Beta Blockers   Reason not to administer Beta Blockers:Not Applicable, took Beta blocker at 0800

## 2011-12-16 NOTE — Discharge Instructions (Signed)
Electrical Cardioversion Cardioversion is the delivery of a jolt of electricity to change the rhythm of the heart. Sticky patches or metal paddles are placed on the chest to deliver the electricity from a special device. This is done to restore a normal rhythm. A rhythm that is too fast or not regular keeps the heart from pumping well. Compared to medicines used to change an abnormal rhythm, cardioversion is faster and works better. It is also unpleasant and may dislodge blood clots from the heart. WHEN WOULD THIS BE DONE?  In an emergency:   There is low or no blood pressure as a result of the heart rhythm.   Normal rhythm must be restored as fast as possible to protect the brain and heart from further damage.   It may save a life.   For less serious heart rhythms, such as atrial fibrillation or flutter, in which:   The heart is beating too fast or is not regular.   The heart is still able to pump enough blood, but not as well as it should.   Medicine to change the rhythm has not worked.   It is safe to wait in order to allow time for preparation.  LET YOUR CAREGIVER KNOW ABOUT:   Every medicine you are taking. It is very important to do this! Know when to take or stop taking any of them.   Any time in the past that you have felt your heart was not beating normally.  RISKS AND COMPLICATIONS   Clots may form in the chambers of the heart if it is beating too fast. These clots may be dislodged during the procedure and travel to other parts of the body.   There is risk of a stroke during and after the procedure if a clot moves. Blood thinners lower this risk.   You may have a special test of your heart (TEE) to make sure there are no clots in your heart.  BEFORE THE PROCEDURE   You may have some tests to see how well your heart is working.   You may start taking blood thinners so your blood does not clot as easily.   Other drugs may be given to help your heart work better.   PROCEDURE (SCHEDULED)  The procedure is typically done in a hospital by a heart doctor (cardiologist).   You will be told when and where to go.   You may be given some medicine through an intravenous (IV) access to reduce discomfort and make you sleepy before the procedure.   Your whole body may move when the shock is delivered. Your chest may feel sore.   You may be able to go home after a few hours. Your heart rhythm will be watched to make sure it does not change.  HOME CARE INSTRUCTIONS   Only take medicine as directed by your caregiver. Be sure you understand how and when to take your medicine.   Learn how to feel your pulse and check it often.   Limit your activity for 48 hours.   Avoid caffeine and other stimulants as directed.  SEEK MEDICAL CARE IF:   You feel like your heart is beating too fast or your pulse is not regular.   You have any questions about your medicines.   You have bleeding that will not stop.  SEEK IMMEDIATE MEDICAL CARE IF:   You are dizzy or feel faint.   It is hard to breathe or you feel short of breath.     There is a change in discomfort in your chest.   Your speech is slurred or you have trouble moving your arm or leg on one side.   You get a muscle cramp.   Your fingers or toes turn cold or blue.  MAKE SURE YOU:   Understand these instructions.   Will watch your condition.   Will get help right away if you are not doing well or get worse.  Document Released: 09/19/2002 Document Revised: 09/18/2011 Document Reviewed: 01/19/2008 [employer] Patient Information 2012 [employer]. 

## 2011-12-16 NOTE — CV Procedure (Signed)
Electrical Cardioversion Procedure Note AL GAGEN [id number] [birth year]  Procedure: Electrical Cardioversion Indications:  Atrial Fibrillation. Prior diastolic heart failure episode with RVR. Currently well rate controlled on metoprolol 100 BID and diltiazem 240 CD. Normal EF.   Time Out: Verified patient identification, verified procedure,medications/allergies/relevent history reviewed, required imaging and test results available.   Procedure Details  The patient was NPO after midnight. Anesthesia was administered at the beside  by Dr.Greg Katrinka Blazing with 30mg  of propofol.  Cardioversion was performed with synchronized biphasic defibrillation via AP pads with 150 joules.  1 attempt(s) were performed.  The patient converted to normal sinus rhythm. The patient tolerated the procedure well.   IMPRESSION:  Successful cardioversion of atrial fibrillation    Titus Drone 12/16/2011, 11:32 AM

## 2011-12-16 NOTE — Interval H&P Note (Signed)
History and Physical Interval Note:  12/16/2011 11:21 AM  Kelsey [name]  has presented today for surgery, with the diagnosis of afib  The various methods of treatment have been discussed with the patient and family. After consideration of risks, benefits and other options for treatment, the patient has consented to  Procedure(s) (LRB): CARDIOVERSION (N/A) as a surgical intervention .  The patients' history has been reviewed, patient examined, no change in status, stable for surgery.  I have reviewed the patients' chart and labs.  Questions were answered to the patient's satisfaction.     Trish Mancinelli

## 2011-12-17 ENCOUNTER — Encounter (HOSPITAL_COMMUNITY): Payer: Self-pay | Admitting: Cardiology

## 2013-10-02 LAB — AMB EXT TSH: TSH, EXTERNAL: 0.73

## 2013-10-26 ENCOUNTER — Encounter (INDEPENDENT_AMBULATORY_CARE_PROVIDER_SITE_OTHER): Payer: Self-pay

## 2013-10-26 ENCOUNTER — Encounter: Payer: Self-pay | Admitting: Cardiology

## 2013-10-26 ENCOUNTER — Ambulatory Visit (INDEPENDENT_AMBULATORY_CARE_PROVIDER_SITE_OTHER): Payer: Medicare Other | Admitting: Cardiology

## 2013-10-26 VITALS — BP 116/67 | HR 61 | Ht 62.0 in | Wt 153.0 lb

## 2013-10-26 DIAGNOSIS — I2789 Other specified pulmonary heart diseases: Secondary | ICD-10-CM

## 2013-10-26 DIAGNOSIS — I10 Essential (primary) hypertension: Secondary | ICD-10-CM | POA: Insufficient documentation

## 2013-10-26 DIAGNOSIS — IMO0002 Reserved for concepts with insufficient information to code with codable children: Secondary | ICD-10-CM | POA: Insufficient documentation

## 2013-10-26 DIAGNOSIS — I5032 Chronic diastolic (congestive) heart failure: Secondary | ICD-10-CM

## 2013-10-26 DIAGNOSIS — J449 Chronic obstructive pulmonary disease, unspecified: Secondary | ICD-10-CM

## 2013-10-26 DIAGNOSIS — E785 Hyperlipidemia, unspecified: Secondary | ICD-10-CM | POA: Insufficient documentation

## 2013-10-26 DIAGNOSIS — I4891 Unspecified atrial fibrillation: Secondary | ICD-10-CM

## 2013-10-26 NOTE — Patient Instructions (Addendum)
Your physician recommends that you continue on your current medications as directed. Please refer to the Current Medication list given to you today.  Your physician wants you to follow-up in: 6 months with Dr. Skains. You will receive a reminder letter in the mail two months in advance. If you don't receive a letter, please call our office to schedule the follow-up appointment.  

## 2013-10-26 NOTE — Progress Notes (Signed)
[address] [place], [place]  [address] Phone: [phone] Fax:  [phone]  Date:  10/26/2013   ID:  Kelsey [name], DOB 11-Jul-1922, MRN [id number]  PCP:  Astrid Divine, MD   History of Present Illness: Kelsey [name] is a 80 y.o. female with atrial fibrillation (cardioversion 3/13) on chronic anticoagulation with prior history of heart failure, COPD, hypertension, hyperlipidemia here for follow up evaluation of atrial fibrillation and heart failure.  Has felt well after cardioversion. An echocardiogram performed on 10/12 demonstrated EF of 55% with moderate to severe tricuspid regurgitation, PA pressure 45 mm mercury, moderately enlarged left atrial size with mild to moderate mitral regurgitation and mild circumferential pericardial effusion.  In [facility], noted CHF. Could not breath, Edema. Hospital stay. Now stable. Afib also. But, INR was not stable. BP was elevated during hospitalization.  No prior CVA, no DM, +HTN, +diastolic HF. Coumadin since October 2012. She did develop an ACE inhibitor cough. She is currently on losartan, doing well. She's been feeling fine. No evidence of worsening diastolic heart failure. Her atrial fibrillation has been under good control  Thyroid was overactive and she was taken off of synthroid. Nodules noted. No biopsy. Unfortunately had a rib fracture while folding laundry. No recent episodes of A. fib. Macular degeneration noted.    Wt Readings from Last 3 Encounters:  10/26/13 153 lb (69.4 kg)  12/16/11 145 lb (65.772 kg)  12/16/11 145 lb (65.772 kg)     No past medical history on file.  Past Surgical History  Procedure Laterality Date  . Cardioversion  12/16/2011    Procedure: CARDIOVERSION;  Surgeon: Donato Schultz, MD;  Location: [facility] OR;  Service: Cardiovascular;  Laterality: N/A;    Current Outpatient Prescriptions  Medication Sig Dispense Refill  . albuterol (PROVENTIL HFA;VENTOLIN HFA) 108 (90 BASE)  MCG/ACT inhaler Inhale 2 puffs into the lungs every 6 (six) hours as needed. For shortness of breath      . albuterol (PROVENTIL) (2.5 MG/3ML) 0.083% nebulizer solution Take 2.5 mg by nebulization every 6 (six) hours.      . Calcium Carbonate-Vitamin D (CALCIUM 600+D) 600-400 MG-UNIT per tablet Take 1 tablet by mouth 2 (two) times daily.      . cholecalciferol (VITAMIN D) 1000 UNITS tablet Take 2,000 Units by mouth daily.      . citalopram (CELEXA) 40 MG tablet Take 40 mg by mouth daily.      Marland Kitchen docusate sodium (COLACE) 100 MG capsule Take 100 mg by mouth at bedtime.      . ferrous gluconate (FERGON) 325 MG tablet Take 325 mg by mouth daily with breakfast.      . fexofenadine (ALLEGRA) 180 MG tablet Take 180 mg by mouth daily as needed. For allergies      . fluticasone (FLOVENT HFA) 44 MCG/ACT inhaler Inhale 1 puff into the lungs 2 (two) times daily.      Marland Kitchen ipratropium (ATROVENT) 0.02 % nebulizer solution Take 500 mcg by nebulization 4 (four) times daily.      Marland Kitchen levofloxacin (LEVAQUIN) 500 MG tablet Take 500 mg by mouth daily.      . Multiple Vitamins-Minerals (PRESERVISION/LUTEIN PO) Take by mouth. TAKE 1 PILL TWICE A DAY      . omeprazole (PRILOSEC) 20 MG capsule Take 20 mg by mouth daily.      . simvastatin (ZOCOR) 5 MG tablet Take 5 mg by mouth at bedtime.      Marland Kitchen  warfarin (COUMADIN) 5 MG tablet Take 5 mg by mouth daily. Sun tue wed thu fri sat 5 mg on mon 7.5 mg      . atenolol (TENORMIN) 100 MG tablet Take 100 mg by mouth daily.       No current facility-administered medications for this visit.    Allergies:    Allergies  Allergen Reactions  . Codeine Nausea And Vomiting  . Morphine And Related Nausea Only  . Promethazine Other (See Comments)    hallucinations    Social History:  The patient  reports that she has quit smoking. She does not have any smokeless tobacco history on file.   ROS:  Please see the history of present illness.   Denies any bleeding, syncope, orthopnea, PND,  chest pain, shortness of breath.     PHYSICAL EXAM: VS:  BP 116/67  Pulse 61  Ht 5\' 2"  (1.575 m)  Wt 153 lb (69.4 kg)  BMI 27.98 kg/m2 Well nourished, well developed, in no acute distressElderly, uses cane for ambulation. HEENT: normalDecreased vision Neck: no JVD Cardiac:  normal S1, S2; RRR; no murmur Lungs:  clear to auscultation bilaterally, no wheezing, rhonchi or rales Abd: soft, nontender, no hepatomegaly Ext: no edema Skin: warm and dry Neuro: no focal abnormalities noted  EKG:  None today Labs: 10/18/13-PT therapeutic 2.5  ASSESSMENT AND PLAN:  1. Atrial fibrillation-doing well post cardioversion. No further occurrence. Off of Synthroid. Atenolol continue. No changes made. 2. Chronic anticoagulation- Dr. Maurice SmallElaine Griffin managing. Therapeutic at last check. No bleeding. 3. Macular degeneration-dry type. Seeing ophthalmologist in [place]. 4. Status post left rib fracture-this occurred while folding laundry. She has previously been on osteoporosis type medication but is no longer. 5. Chronic diastolic heart failure-no further exacerbations. Doing very well. Atrial fibrillation seem to exacerbate this previously. 6. We will see back in 6 months.  Signed, Donato SchultzMark Skains, MD [facility] Center[facility]  10/26/2013 9:57 AM

## 2014-06-20 LAB — AMB EXT TSH: TSH, EXTERNAL: 5.942

## 2015-07-26 LAB — AMB EXT TSH: TSH, EXTERNAL: 0.1

## 2016-02-18 ENCOUNTER — Emergency Department (HOSPITAL_COMMUNITY): Payer: Medicare Other

## 2016-02-18 ENCOUNTER — Emergency Department (HOSPITAL_COMMUNITY)
Admission: EM | Admit: 2016-02-18 | Discharge: 2016-02-18 | Disposition: A | Discharge status: Home / Self Care | Payer: Medicare Other | Attending: Emergency Medicine | Admitting: Emergency Medicine

## 2016-02-18 ENCOUNTER — Encounter (HOSPITAL_COMMUNITY): Payer: Self-pay

## 2016-02-18 DIAGNOSIS — S0031XA Abrasion of nose, initial encounter: Secondary | ICD-10-CM | POA: Insufficient documentation

## 2016-02-18 DIAGNOSIS — Z96641 Presence of right artificial hip joint: Secondary | ICD-10-CM | POA: Insufficient documentation

## 2016-02-18 DIAGNOSIS — S0083XA Contusion of other part of head, initial encounter: Secondary | ICD-10-CM | POA: Insufficient documentation

## 2016-02-18 DIAGNOSIS — E785 Hyperlipidemia, unspecified: Secondary | ICD-10-CM | POA: Insufficient documentation

## 2016-02-18 DIAGNOSIS — S0990XA Unspecified injury of head, initial encounter: Secondary | ICD-10-CM | POA: Diagnosis not present

## 2016-02-18 DIAGNOSIS — M810 Age-related osteoporosis without current pathological fracture: Secondary | ICD-10-CM | POA: Insufficient documentation

## 2016-02-18 DIAGNOSIS — W010XXA Fall on same level from slipping, tripping and stumbling without subsequent striking against object, initial encounter: Secondary | ICD-10-CM | POA: Diagnosis not present

## 2016-02-18 DIAGNOSIS — Z79899 Other long term (current) drug therapy: Secondary | ICD-10-CM | POA: Insufficient documentation

## 2016-02-18 DIAGNOSIS — J449 Chronic obstructive pulmonary disease, unspecified: Secondary | ICD-10-CM | POA: Diagnosis not present

## 2016-02-18 DIAGNOSIS — Z87891 Personal history of nicotine dependence: Secondary | ICD-10-CM | POA: Insufficient documentation

## 2016-02-18 DIAGNOSIS — Z7901 Long term (current) use of anticoagulants: Secondary | ICD-10-CM | POA: Diagnosis not present

## 2016-02-18 DIAGNOSIS — M19011 Primary osteoarthritis, right shoulder: Secondary | ICD-10-CM | POA: Diagnosis not present

## 2016-02-18 DIAGNOSIS — K219 Gastro-esophageal reflux disease without esophagitis: Secondary | ICD-10-CM | POA: Diagnosis not present

## 2016-02-18 DIAGNOSIS — F329 Major depressive disorder, single episode, unspecified: Secondary | ICD-10-CM | POA: Insufficient documentation

## 2016-02-18 DIAGNOSIS — M25511 Pain in right shoulder: Secondary | ICD-10-CM | POA: Diagnosis present

## 2016-02-18 DIAGNOSIS — S40011A Contusion of right shoulder, initial encounter: Secondary | ICD-10-CM | POA: Diagnosis not present

## 2016-02-18 DIAGNOSIS — Y929 Unspecified place or not applicable: Secondary | ICD-10-CM | POA: Diagnosis not present

## 2016-02-18 DIAGNOSIS — W19XXXA Unspecified fall, initial encounter: Secondary | ICD-10-CM

## 2016-02-18 DIAGNOSIS — I4891 Unspecified atrial fibrillation: Secondary | ICD-10-CM | POA: Insufficient documentation

## 2016-02-18 DIAGNOSIS — Y9301 Activity, walking, marching and hiking: Secondary | ICD-10-CM | POA: Insufficient documentation

## 2016-02-18 DIAGNOSIS — Z7951 Long term (current) use of inhaled steroids: Secondary | ICD-10-CM | POA: Insufficient documentation

## 2016-02-18 DIAGNOSIS — E039 Hypothyroidism, unspecified: Secondary | ICD-10-CM | POA: Insufficient documentation

## 2016-02-18 DIAGNOSIS — Y999 Unspecified external cause status: Secondary | ICD-10-CM | POA: Insufficient documentation

## 2016-02-18 DIAGNOSIS — Z96659 Presence of unspecified artificial knee joint: Secondary | ICD-10-CM | POA: Insufficient documentation

## 2016-02-18 HISTORY — DX: Unspecified atrial fibrillation: I48.91

## 2016-02-18 MED ORDER — TRAMADOL-ACETAMINOPHEN 37.5-325 MG PO TABS: 1.0000 | ORAL_TABLET | Freq: Two times a day (BID) | ORAL | Status: DC | PRN

## 2016-02-18 NOTE — ED Provider Notes (Signed)
CSN: [id number]     Arrival date & time 02/18/16  1118 History   First MD Initiated Contact with Patient 02/18/16 1532     Chief Complaint  Patient presents with  . Fall  . Shoulder Pain     (Consider location/radiation/quality/duration/timing/severity/associated sxs/prior Treatment) Patient is a 82 y.o. female presenting with fall and shoulder pain. The history is provided by the patient and a relative.  Fall  Shoulder Pain    Kelsey [name] is a 82 y.o. female who presents for evaluation of injury to right shoulder. She states that she was walking, using her crutch, with a crutch became entangled in a rug, which caused her to fall onto her right side. She was able to get up and walk afterwards, and is able to ambulate now without using her crutch. She has used her crutches ever since her right hip replacement, as an aid to help her walk. She denies headache, neck pain, back pain, nausea, vomiting, weakness or dizziness. She denies recent illnesses. She is taking her usual medications, as prescribed. There are no other known modifying factors.   Past Medical History  Diagnosis Date  . Hypothyroidism   . GERD (gastroesophageal reflux disease)   . Osteoporosis   . Hyperlipidemia   . COPD (chronic obstructive pulmonary disease) (HCC)   . Depression   . Seasonal allergies   . Restless leg syndrome   . Asthma   . Atrial fibrillation [facility])    Past Surgical History  Procedure Laterality Date  . Cardioversion  12/16/2011    Procedure: CARDIOVERSION;  Surgeon: Donato Schultz, MD;  Location: [facility] OR;  Service: Cardiovascular;  Laterality: N/A;  . Thyroidectomy  1970    for benign tumor  . Breast cyst excision  1970    breast cyst biopsy-benign  . Vaginal hysterectomy  1992    partial vaginal hysterectomy  . Other surgical history  1995    left cataract removal with IOL  . Bunionectomy  08/02    right bunionectomy  . Other surgical history  2003    right cataract with IOL  . Joint  replacement    . Total knee arthroplasty Bilateral   . Total hip arthroplasty Left    History reviewed. No pertinent family history. Social History  Substance Use Topics  . Smoking status: Former Games developer  . Smokeless tobacco: None     Comment: QUIT  . Alcohol Use: No   OB History    No data available     Review of Systems  All other systems reviewed and are negative.     Allergies  Codeine; Morphine and related; and Promethazine  Home Medications   Prior to Admission medications   Medication Sig Start Date End Date Taking? Authorizing Provider  albuterol (PROVENTIL) (2.5 MG/3ML) 0.083% nebulizer solution Take 2.5 mg by nebulization every 4 (four) hours as needed for wheezing or shortness of breath.    Yes Historical Provider, MD  cholecalciferol (VITAMIN D) 1000 UNITS tablet Take 2,000 Units by mouth daily.   Yes Historical Provider, MD  diltiazem (TIAZAC) 240 MG 24 hr capsule Take 240 mg by mouth daily.   Yes Historical Provider, MD  diphenhydramine-acetaminophen (TYLENOL PM) 25-500 MG TABS tablet Take 1 tablet by mouth at bedtime as needed (sleep).   Yes Historical Provider, MD  ferrous gluconate (FERGON) 325 MG tablet Take 325 mg by mouth daily with breakfast.   Yes Historical Provider, MD  fexofenadine (ALLEGRA) 180 MG tablet Take 180 mg by  mouth daily. For allergies   Yes Historical Provider, MD  FLUoxetine (PROZAC) 20 MG capsule Take 20 mg by mouth daily.   Yes Historical Provider, MD  furosemide (LASIX) 20 MG tablet Take 20 mg by mouth daily.   Yes Historical Provider, MD  ipratropium (ATROVENT HFA) 17 MCG/ACT inhaler Inhale 2 puffs into the lungs every 6 (six) hours.   Yes Historical Provider, MD  losartan (COZAAR) 25 MG tablet Take 25 mg by mouth daily.   Yes Historical Provider, MD  metoprolol (LOPRESSOR) 100 MG tablet Take 100 mg by mouth 2 (two) times daily.   Yes Historical Provider, MD  omeprazole (PRILOSEC) 20 MG capsule Take 20 mg by mouth daily.   Yes  Historical Provider, MD  senna-docusate (SENOKOT-S) 8.6-50 MG tablet Take 1 tablet by mouth at bedtime as needed for mild constipation or moderate constipation.   Yes Historical Provider, MD  simvastatin (ZOCOR) 5 MG tablet Take 5 mg by mouth at bedtime.   Yes Historical Provider, MD  warfarin (COUMADIN) 5 MG tablet Take 5 mg by mouth daily. 5mg  Sunday through Thursday and 4mg  Friday and Saturday (as of 02/18/16).   Yes Historical Provider, MD   BP 133/72 mmHg  Pulse 67  Temp(Src) 98.4 F (36.9 C) (Oral)  Resp 15  SpO2 96% Physical Exam  Constitutional: She is oriented to person, place, and time. She appears well-developed and well-nourished. No distress.  HENT:  Head: Normocephalic.  Right Ear: External ear normal.  Left Ear: External ear normal.  Small contusion lateral right forehead, no associated abrasion or laceration. A small abrasion, right nose, at bridge. The abrasion is not bleeding. There is no associated crepitation, or swelling.  Eyes: Conjunctivae and EOM are normal. Pupils are equal, round, and reactive to light.  Neck: Normal range of motion and phonation normal. Neck supple.  Cardiovascular: Normal rate, regular rhythm and normal heart sounds.   Pulmonary/Chest: Effort normal and breath sounds normal. She exhibits no tenderness (No crepitation of the chest wall.) and no bony tenderness.  Abdominal: Soft. There is no tenderness.  Musculoskeletal:  Moderate right shoulder tenderness, diffusely, without associated deformity. She resists any motion of the right shoulder both actively, and passively. There is no palpable tenderness or step-off of the cervical, thoracic or lumbar spines.  Neurological: She is alert and oriented to person, place, and time. No cranial nerve deficit or sensory deficit. She exhibits normal muscle tone. Coordination normal.  Skin: Skin is warm, dry and intact.  Psychiatric: She has a normal mood and affect. Her behavior is normal. Judgment and thought  content normal.  Nursing note and vitals reviewed.   ED Course  Procedures (including critical care time)  Medications - No data to display  Patient Vitals for the past 24 hrs:  BP Temp Temp src Pulse Resp SpO2  02/18/16 1458 133/72 mmHg 98.4 F (36.9 C) Oral 67 15 96 %  02/18/16 1211 138/81 mmHg 97.7 F (36.5 C) Oral 71 16 98 %    4:52 PM Reevaluation with update and discussion. After initial assessment and treatment, an updated evaluation reveals No additional complaints. Findings discussed with patient and her daughter, all questions were answered. Hezzie Karim L    Labs Review Labs Reviewed - No data to display  Imaging Review Dg Shoulder Right  02/18/2016  CLINICAL DATA:  Pain following fall EXAM: RIGHT SHOULDER - 2+ VIEW COMPARISON:  None. FINDINGS: Frontal and attempted Y scapular images were obtained. There is no demonstrable acute fracture or  dislocation. There is marked narrowing of the glenohumeral joint with remodeling of the proximal humeral head. There is moderate narrowing of the acromioclavicular joint. No bony destruction. 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 Visualized right lung clear. IMPRESSION: Extensive arthropathy, most pronounced in the glenohumeral joint. 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 No acute fracture or dislocation is evident. Electronically Signed   By: Bretta Bang III M.D.   On: 02/18/2016 12:39   I have personally reviewed and evaluated these images and lab results as part of my medical decision-making.   EKG Interpretation None      MDM   Final diagnoses:  Fall, initial encounter  Contusion of right shoulder, initial encounter  Arthritis of right shoulder region  Head injury, initial encounter    Fall, likely mechanical, without serious injury. Right shoulder is very painful, and I cannot rule out a rotator cuff injury. However, likely she  experienced a contusion, which aggravated her chronic right shoulder pain. Doubt serious head injury, neck or back injury.  Nursing Notes Reviewed/ Care Coordinated Applicable Imaging Reviewed Interpretation of Laboratory Data incorporated into ED treatment  The patient appears reasonably screened and/or stabilized for discharge and I doubt any other medical condition or other [facility] requiring further screening, evaluation, or treatment in the ED at this time prior to discharge.  Plan: Home Medications- Ultracet; Home Treatments- rest, ice, sling; return here if the recommended treatment, does not improve the symptoms; Recommended follow up- Ortho if not better in 1 week. PCP prn. Return here if needed.     Mancel Bale, MD 02/18/16 [phone]

## 2016-02-18 NOTE — ED Notes (Signed)
Pt c/o R shoulder pain after a trip and fall this morning.  Pain score 5/10 increasing w/ movement.  Hx of previous fracture.  Limited ROM noted.

## 2016-02-18 NOTE — Discharge Instructions (Signed)
Arthritis Arthritis is a term that is commonly used to refer to joint pain or joint disease. There are more than 100 types of arthritis. CAUSES The most common cause of this condition is wear and tear of a joint. Other causes include:  Gout.  Inflammation of a joint.  An infection of a joint.  Sprains and other injuries near the joint.  A drug reaction or allergic reaction. In some cases, the cause may not be known. SYMPTOMS The main symptom of this condition is pain in the joint with movement. Other symptoms include:  Redness, swelling, or stiffness at a joint.  Warmth coming from the joint.  Fever.  Overall feeling of illness. DIAGNOSIS This condition may be diagnosed with a physical exam and tests, including:  Blood tests.  Urine tests.  Imaging tests, such as MRI, X-rays, or a CT scan. Sometimes, fluid is removed from a joint for testing. TREATMENT Treatment for this condition may involve:  Treatment of the cause, if it is known.  Rest.  Raising (elevating) the joint.  Applying cold or hot packs to the joint.  Medicines to improve symptoms and reduce inflammation.  Injections of a steroid such as cortisone into the joint to help reduce pain and inflammation. Depending on the cause of your arthritis, you may need to make lifestyle changes to reduce stress on your joint. These changes may include exercising more and losing weight. HOME CARE INSTRUCTIONS Medicines  Take over-the-counter and prescription medicines only as told by your health care provider.  Do not take aspirin to relieve pain if gout is suspected. Activities  Rest your joint if told by your health care provider. Rest is important when your disease is active and your joint feels painful, swollen, or stiff.  Avoid activities that make the pain worse. It is important to balance activity with rest.  Exercise your joint regularly with range-of-motion exercises as told by your health care  provider. Try doing low-impact exercise, such as:  Swimming.  Water aerobics.  Biking.  Walking. Joint Care  If your joint is swollen, keep it elevated if told by your health care provider.  If your joint feels stiff in the morning, try taking a warm shower.  If directed, apply heat to the joint. If you have diabetes, do not apply heat without permission from your health care provider.  Put a towel between the joint and the hot pack or heating pad.  Leave the heat on the area for 20-30 minutes.  If directed, apply ice to the joint:  Put ice in a plastic bag.  Place a towel between your skin and the bag.  Leave the ice on for 20 minutes, 2-3 times per day.  Keep all follow-up visits as told by your health care provider. This is important. SEEK MEDICAL CARE IF:  The pain gets worse.  You have a fever. SEEK IMMEDIATE MEDICAL CARE IF:  You develop severe joint pain, swelling, or redness.  Many joints become painful and swollen.  You develop severe back pain.  You develop severe weakness in your leg.  You cannot control your bladder or bowels.   This information is not intended to replace advice given to you by your health care provider. Make sure you discuss any questions you have with your health care provider.   Document Released: 11/06/2004 Document Revised: 06/20/2015 Document Reviewed: 12/25/2014 Elsevier Interactive Patient Education 2016 [employer]  Contusion A contusion is a deep bruise. Contusions happen when an injury causes bleeding  under the skin. Symptoms of bruising include pain, swelling, and discolored skin. The skin may turn blue, purple, or yellow. HOME CARE   Rest the injured area.  If told, put ice on the injured area.  Put ice in a plastic bag.  Place a towel between your skin and the bag.  Leave the ice on for 20 minutes, 2-3 times per day.  If told, put light pressure (compression) on the injured area using an elastic bandage.  Make sure the bandage is not too tight. Remove it and put it back on as told by your doctor.  If possible, raise (elevate) the injured area above the level of your heart while you are sitting or lying down.  Take over-the-counter and prescription medicines only as told by your doctor. GET HELP IF:  Your symptoms do not get better after several days of treatment.  Your symptoms get worse.  You have trouble moving the injured area. GET HELP RIGHT AWAY IF:   You have very bad pain.  You have a loss of feeling (numbness) in a hand or foot.  Your hand or foot turns pale or cold.   This information is not intended to replace advice given to you by your health care provider. Make sure you discuss any questions you have with your health care provider.   Document Released: 03/17/2008 Document Revised: 06/20/2015 Document Reviewed: 02/14/2015 Elsevier Interactive Patient Education 2016 [employer]  Head Injury, Adult You have a head injury. Headaches and throwing up (vomiting) are common after a head injury. It should be easy to wake up from sleeping. Sometimes you must stay in the hospital. Most problems happen within the first 24 hours. Side effects may occur up to 7-10 days after the injury.  WHAT ARE THE TYPES OF HEAD INJURIES? Head injuries can be as minor as a bump. Some head injuries can be more severe. More severe head injuries include:  A jarring injury to the brain (concussion).  A bruise of the brain (contusion). This mean there is bleeding in the brain that can cause swelling.  A cracked skull (skull fracture).  Bleeding in the brain that collects, clots, and forms a bump (hematoma). WHEN SHOULD I GET HELP RIGHT AWAY?   You are confused or sleepy.  You cannot be woken up.  You feel sick to your stomach (nauseous) or keep throwing up (vomiting).  Your dizziness or unsteadiness is getting worse.  You have very bad, lasting headaches that are not helped by medicine.  Take medicines only as told by your doctor.  You cannot use your arms or legs like normal.  You cannot walk.  You notice changes in the black spots in the center of the colored part of your eye (pupil).  You have clear or bloody fluid coming from your nose or ears.  You have trouble seeing. During the next 24 hours after the injury, you must stay with someone who can watch you. This person should get help right away (call 911 in the [place] if you start to shake and are not able to control it (have seizures), you pass out, or you are unable to wake up. HOW CAN I PREVENT A HEAD INJURY IN THE FUTURE?  Wear seat belts.  Wear a helmet while bike riding and playing sports like football.  Stay away from dangerous activities around the house. WHEN CAN I RETURN TO NORMAL ACTIVITIES AND ATHLETICS? See your doctor before doing these activities. You should not do normal activities  or play contact sports until 1 week after the following symptoms have stopped:  Headache that does not go away.  Dizziness.  Poor attention.  Confusion.  Memory problems.  Sickness to your stomach or throwing up.  Tiredness.  Fussiness.  Bothered by bright lights or loud noises.  Anxiousness or depression.  Restless sleep. MAKE SURE YOU:   Understand these instructions.  Will watch your condition.  Will get help right away if you are not doing well or get worse.   This information is not intended to replace advice given to you by your health care provider. Make sure you discuss any questions you have with your health care provider.   Document Released: 09/11/2008 Document Revised: 10/20/2014 Document Reviewed: 06/06/2013 Elsevier Interactive Patient Education [employer]

## 2016-09-02 LAB — AMB EXT TSH: TSH, EXTERNAL: 0.633

## 2016-09-26 NOTE — Progress Notes (Signed)
Visit Type:  Follow-up Visit   Referring Provider:  Leonette Nuttinglarke Baxter, MD   Primary Provider:  Leonette Nuttinglarke Baxter, MD      CC:  hyperthyroidism.      History of Present Illness:   82 year old with toxic multinodular goiter presents doing well last TSH normal however the patient still on Tapazole 5 mg one half every third day and I had gone up on medication for mood TSH of several times over the last year unclear will receive those letters.  Though she is on is actually an overdose however her recent numbers on this normal social continue on Tapazole 5 mg one half tablet every third day we will check a blood test next month and everyone now is on the same page.  We need to use the lowest dose of medication may keeps her thyroid levels normal.  Ultrasound suggests stable nodules 2014 and 2015 I don't think we need to repeat, right lobe no change on exam today inferior lower nodule about 2-1/2 cm small nodule above.  No local symptoms euthyroid clinically no abnormal lymph nodes denies hoarseness of dysphasia.  Daughter tells me fibrillation weight stable on present meds cardiovascular state stable obviously want to keep her euthyroid through this process she is not wanted thyroid scan do not think there's a dominant cold nodule would not make sense with history is not 1 definitive surgery or iodine this was reviewed again.  We'll see her in the year and he neck enlargement before the trouble swallowing or hoarseness with prompt sooner evaluation.         Endocrinology History      Hyperthyroidism chemical low TSH T4 and T3 normal status post removal left lobe and isthsmus   1970 left lobe and isthsmus removed benign tumor question on thyroid hormone   2014 low TSH normal T4 and T3 thyroid hormone C flowsheet   06/23/2013 US left lobe and isthsmus gone RT 3.7 x 2.1 x 1.3 heterogeneous multiple hypoechoic nodules largest 2.7 cm in length multiple confluent solipsistic mostly spongiform looking nodules none particularly  suspicious.      Osteoporosis no details, Fractured right shoulder humeral head 1998 treated with sliding   G2 P2 menarche age 82 menopausal age 82      Atrial fibrillation since 2012 on Coumadin   GERD/hyperlipidemia/hypertension/spinal stenosis/hypertension/restless leg syndrome/depression/osteoarthritis status post left hip and right knee and left knee replacement   Polio age 89            Past Medical History:      Reviewed history from 09/30/2013 and no changes required:         G2 P2         Menopause-Age 63-Estrogen used for 1 month                  Polio      Past Surgical History:      Reviewed history from 09/30/2013 and no changes required:         Cataract         Partial thyroidectomy         Hysterectomy         Breast Cyst-Bilateral         Hip replacement         Bilateral TKR         Bunionectomy      Family History Summary:       Reviewed history Last on 07/26/2015 and no changes required:07/18/2016  General Comments - FH:   M: Lived to age 64-Migraines   F: Aneurysm-Fell down stairs   Brother: Heart Disease, Kidney Cancer, Macular Degeneration   Brother:Deceased- Heart Disease, Macular Degeneration   Daughter: Anita-Thyroid Cancer, Uterine Fibroids, Ovarian Fibroid, Cataract Surgery, GERD-Severe, Lung Issues-Work Related, Asthma   Daughter: Dottie-Cataract, Hysterectomy, Female Cancer-caught early, Arthritis, Atrial Fib                  Social History:      Reviewed history from 09/30/2013 and no changes required:         Widowed         Retired                  Tobacco Use    Former smoker         For women, in the past year how often have you had 4 or more drinks a day? never   In the past year, how often have you used prescription drugs for non medical reasons? (example: getting high) never   In the past year, how often have you used illegal drugs?  never      Exercise    How many times per week do you usually exercise? 0      Nutrition    Caffeine use (drinks/day) 0          Review of Systems         See HPI         Vital Signs    Transition of Care:  PCP   Patient Identity Verified with 2 Methods  Yes      Pain Assessment    Are you having significant pain today that you would like to discuss with the provider you are seeing today?no   BP Location:  Left arm   BP Cuff Size:  Regular   Method:  Automatic      Final BP: 137 / 85   Blood Pressure: 137/85 mm Hg   Pulse #1 83   Height: 61.5 inches 156.2 cm       Entered Weight 142.6 lb Calculated Weight: 142.60 lb.  64.82kg   Change since last visit: 1.10 pounds    Body Mass Index: 26.60      Body Surface Area (m2): 1.65      Documented By: Algis Downs CMA (July 18, 2016 9:42 AM)      Medications were reviewed and reconciled with the patient during this visit including over the counter medications.          Allergies:    ENALAPRIL MALEATE (ENALAPRIL MALEATE TABS) (Critical)   PROMETHAZINE HCL (Critical)   CODEINE (Critical)   LORAZEPAM (LORAZEPAM TABS) (Critical)   MORPHINE (Critical)   PHENERGAN (PROMETHAZINE HCL SOLN) (Critical)   ATIVAN (Critical)   DIGOXIN (DIGOXIN TABS) (Critical)      Allergies were reviewed with the patient during this visit.         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) (ICD10-M81.0)   DEPRESSION (ICD-311) (ICD10-F32.9)   RESTLESS LEG SYNDROME (ICD-333.94) (ICD10-G25.81)   SPINAL STENOSIS (ICD-724.00) (ICD10-M48.00)   HYPERTENSION (ICD-401.9) (ICD10-I10)   OSTEOARTHRITIS (ICD-715.90) (ICD10-M19.90)   HYPERLIPIDEMIA (ICD-272.4) (ICD10-E78.5)   GERD (ICD-530.81) (BJY78-G95.9)   COPD (ICD-496) (ICD10-J44.9)   MACULAR DEGENERATION (ICD-362.50) (ICD10-H35.30)      Active Medications:    TAPAZOLE 5  MG ORAL TABS (METHIMAZOLE) Take 1 tablet every other day or as directed    WARFARIN SODIUM 5 MG TABS (WARFARIN SODIUM) Take 1 tab by mouth daily   SIMVASTATIN 5 MG TABS (SIMVASTATIN) Take 1 tab by mouth daily   VITAMIN D 2000 UNIT CAPS (CHOLECALCIFEROL) Take 1 tab by mouth daily   OMEPRAZOLE 20 MG TBEC (OMEPRAZOLE) Take 1 tab by mouth daily   FEXOFENADINE HCL 180 MG TABS (FEXOFENADINE HCL) Take 1 tab by mouth daily   ALBUTEROL SULFATE (2.5 MG/3ML) 0.083% NEBU (ALBUTEROL SULFATE) as needed   FLUOXETINE HCL 20 MG TABS (FLUOXETINE HCL) Take 1 tab by mouth daily   ATROVENT HFA 17 MCG/ACT AERS (IPRATROPIUM BROMIDE HFA) 2 puffs four times a day   FUROSEMIDE 20 MG TABS (FUROSEMIDE) Take 1 tab by mouth daily   TYLENOL PM EXTRA STRENGTH 500-25 MG TABS (DIPHENHYDRAMINE-APAP (SLEEP)) Take 1 tab by mouth at bedtime as needed   METOPROLOL SUCCINATE ER 100 MG XR24H-TAB (METOPROLOL SUCCINATE) Take 1 tab by mouth twice a day   SENOKOT S 8.6-50 MG TABS (SENNOSIDES-DOCUSATE SODIUM) Take 1 tab by mouth daily   FERROUS SULFATE 325 (65 FE) MG TABS (FERROUS SULFATE) Take 1 tab by mouth daily   LOSARTAN POTASSIUM 25 MG TABS (LOSARTAN POTASSIUM) Take 1 tab by mouth daily   DILTIAZEM HCL ER 240 MG XR24H-CAP (DILTIAZEM HCL) Take 1 tab by mouth daily   PRESERVISION AREDS 2 CAPS (MULTIPLE VITAMINS-MINERALS) Take 1 tab by mouth daily               Physical Exam      General:       Elderly but no apparent distress   Head:       normocephalic and atraumatic   Eyes:       No ophthalmopathy   Neck:       Right lobe lower neck with swallowing and feels about 2 in half centimeter nodule above this smaller nodule freely movable no lymph nodes left lobe previously surgically absent   Lungs:       Clear   Heart:       No murmur irregular rhythm   Pulses:       Carotids 2+ no bruits   Extremities:       No tremor   Neurologic:       Reflexes not brisk or delayed            Impression & Recommendations:      Problem # 1:  HYPERTHYROIDISM, SUBCLINICAL (ICD-242.90) (ICD10-E05.90)    Continued to use minimal dose Tapazole keeping TSH normal   Her updated medication list for this problem includes:      Tapazole 5 Mg Oral Tabs (Methimazole) .Marland Kitchen.... Take 1 tablet every other day or as directed      Metoprolol Succinate Er 100 Mg Xr24h-tab (Metoprolol succinate) .Marland Kitchen.... Take 1 tab by mouth twice a day      Orders:   Ofc Vst, Est Lvl 4 ([id number])         Problem # 2:  GOITER, TOXIC, MULTINODULAR (ICD-242.20) (ICD10-E05.20)   Ultrasound has been stable multiple confluent nodules nothing particularly suspicious hold scan hold radioactive iodine holdsurgery hold biopsy considerations just treat with Tapazole   Her updated medication list for this problem includes:      Tapazole 5 Mg Oral Tabs (Methimazole) .Marland Kitchen.... Take 1 tablet every other day or as directed      Metoprolol Succinate Er 100 Mg Xr24h-tab (Metoprolol  succinate) .Marland Kitchen... Take 1 tab by mouth twice a day      Orders:   Ofc Vst, Est Lvl 4 ([id number])         Problem # 3:  ATRIAL FIBRILLATION (ICD-427.31) (ICD10-I48.0)   Continue present medications obviously keep TSH controlled   Orders:   Ofc Vst, Est Lvl 4 ([id number])         Problem # 4:  COPD (ICD-496) (ICD10-J44.9)   Obviously keep TSH controlled to avoid cardiopulmonary contribution to any cardiovascular issues.   Orders:   Ofc Vst, Est Lvl 4 ([id number])                     Orders:   Added new Service order of Ofc Vst, Est Lvl 4 ([id number]) - Signed            I have either personally documented or reviewed the patients past medical, surgical, family and social history and review of systems if present in the note.          Electronically signed by Lyndel Pleasure MD on 07/18/2016 at 10:33 AM   ________________________________________________________________________

## 2017-02-24 ENCOUNTER — Encounter

## 2017-07-17 ENCOUNTER — Ambulatory Visit: Attending: "Endocrinology

## 2017-10-24 ENCOUNTER — Encounter (HOSPITAL_COMMUNITY): Payer: Self-pay | Admitting: Emergency Medicine

## 2017-10-24 ENCOUNTER — Emergency Department (HOSPITAL_COMMUNITY): Payer: Medicare Other

## 2017-10-24 ENCOUNTER — Other Ambulatory Visit: Payer: Self-pay

## 2017-10-24 ENCOUNTER — Emergency Department (HOSPITAL_COMMUNITY)
Admission: EM | Admit: 2017-10-24 | Discharge: 2017-10-24 | Disposition: A | Discharge status: Home / Self Care | Payer: Medicare Other | Attending: Emergency Medicine | Admitting: Emergency Medicine

## 2017-10-24 DIAGNOSIS — N390 Urinary tract infection, site not specified: Secondary | ICD-10-CM

## 2017-10-24 DIAGNOSIS — Z96653 Presence of artificial knee joint, bilateral: Secondary | ICD-10-CM | POA: Diagnosis not present

## 2017-10-24 DIAGNOSIS — S9032XA Contusion of left foot, initial encounter: Secondary | ICD-10-CM | POA: Insufficient documentation

## 2017-10-24 DIAGNOSIS — E039 Hypothyroidism, unspecified: Secondary | ICD-10-CM | POA: Insufficient documentation

## 2017-10-24 DIAGNOSIS — J45909 Unspecified asthma, uncomplicated: Secondary | ICD-10-CM | POA: Diagnosis not present

## 2017-10-24 DIAGNOSIS — S0083XA Contusion of other part of head, initial encounter: Secondary | ICD-10-CM | POA: Insufficient documentation

## 2017-10-24 DIAGNOSIS — J449 Chronic obstructive pulmonary disease, unspecified: Secondary | ICD-10-CM | POA: Insufficient documentation

## 2017-10-24 DIAGNOSIS — I5032 Chronic diastolic (congestive) heart failure: Secondary | ICD-10-CM | POA: Insufficient documentation

## 2017-10-24 DIAGNOSIS — W0110XA Fall on same level from slipping, tripping and stumbling with subsequent striking against unspecified object, initial encounter: Secondary | ICD-10-CM | POA: Insufficient documentation

## 2017-10-24 DIAGNOSIS — W19XXXA Unspecified fall, initial encounter: Secondary | ICD-10-CM

## 2017-10-24 DIAGNOSIS — Z96642 Presence of left artificial hip joint: Secondary | ICD-10-CM | POA: Insufficient documentation

## 2017-10-24 DIAGNOSIS — I110 Hypertensive heart disease with heart failure: Secondary | ICD-10-CM | POA: Diagnosis not present

## 2017-10-24 DIAGNOSIS — Z79899 Other long term (current) drug therapy: Secondary | ICD-10-CM | POA: Insufficient documentation

## 2017-10-24 DIAGNOSIS — Y929 Unspecified place or not applicable: Secondary | ICD-10-CM | POA: Diagnosis not present

## 2017-10-24 DIAGNOSIS — R51 Headache: Secondary | ICD-10-CM | POA: Diagnosis not present

## 2017-10-24 DIAGNOSIS — Z7901 Long term (current) use of anticoagulants: Secondary | ICD-10-CM | POA: Insufficient documentation

## 2017-10-24 DIAGNOSIS — T07XXXA Unspecified multiple injuries, initial encounter: Secondary | ICD-10-CM

## 2017-10-24 DIAGNOSIS — Y9301 Activity, walking, marching and hiking: Secondary | ICD-10-CM | POA: Diagnosis not present

## 2017-10-24 DIAGNOSIS — R079 Chest pain, unspecified: Secondary | ICD-10-CM | POA: Diagnosis present

## 2017-10-24 DIAGNOSIS — Y999 Unspecified external cause status: Secondary | ICD-10-CM | POA: Diagnosis not present

## 2017-10-24 LAB — CBC WITH DIFFERENTIAL/PLATELET
Basophils Absolute: 0 10*3/uL (ref 0.0–0.1)
Basophils Relative: 0 %
Eosinophils Absolute: 0 10*3/uL (ref 0.0–0.7)
Eosinophils Relative: 0 %
HCT: 42 % (ref 36.0–46.0)
Hemoglobin: 13.8 g/dL (ref 12.0–15.0)
Lymphocytes Relative: 3 %
Lymphs Abs: 0.6 10*3/uL — ABNORMAL LOW (ref 0.7–4.0)
MCH: 31.8 pg (ref 26.0–34.0)
MCHC: 32.9 g/dL (ref 30.0–36.0)
MCV: 96.8 fL (ref 78.0–100.0)
Monocytes Absolute: 1.7 10*3/uL — ABNORMAL HIGH (ref 0.1–1.0)
Monocytes Relative: 8 %
Neutro Abs: 18.3 10*3/uL — ABNORMAL HIGH (ref 1.7–7.7)
Neutrophils Relative %: 89 %
Platelets: 241 10*3/uL (ref 150–400)
RBC: 4.34 MIL/uL (ref 3.87–5.11)
RDW: 15.7 % — ABNORMAL HIGH (ref 11.5–15.5)
WBC: 20.7 10*3/uL — ABNORMAL HIGH (ref 4.0–10.5)

## 2017-10-24 LAB — PROTIME-INR
INR: 3.84
Prothrombin Time: 37.5 seconds — ABNORMAL HIGH (ref 11.4–15.2)

## 2017-10-24 LAB — BASIC METABOLIC PANEL
Anion gap: 12 (ref 5–15)
BUN: 42 mg/dL — ABNORMAL HIGH (ref 6–20)
CO2: 23 mmol/L (ref 22–32)
Calcium: 8.2 mg/dL — ABNORMAL LOW (ref 8.9–10.3)
Chloride: 107 mmol/L (ref 101–111)
Creatinine, Ser: 0.83 mg/dL (ref 0.44–1.00)
GFR calc Af Amer: >60 mL/min (ref >60)
GFR calc non Af Amer: 59 mL/min — ABNORMAL LOW (ref >60)
Glucose, Bld: 103 mg/dL — ABNORMAL HIGH (ref 65–99)
Potassium: 3.5 mmol/L (ref 3.5–5.1)
Sodium: 142 mmol/L (ref 135–145)

## 2017-10-24 LAB — URINALYSIS, ROUTINE W REFLEX MICROSCOPIC
Bilirubin Urine: NEGATIVE
Glucose, UA: NEGATIVE mg/dL
Hgb urine dipstick: NEGATIVE
Ketones, ur: 5 mg/dL — AB
Nitrite: NEGATIVE
Protein, ur: 100 mg/dL — AB
Specific Gravity, Urine: 1.028 (ref 1.005–1.030)
pH: 5 (ref 5.0–8.0)

## 2017-10-24 LAB — I-STAT CG4 LACTIC ACID, ED: Lactic Acid, Venous: 2.47 mmol/L (ref 0.5–1.9)

## 2017-10-24 MED ORDER — DILTIAZEM HCL ER COATED BEADS 240 MG PO CP24
240.0000 mg | ORAL_CAPSULE | Freq: Every day | ORAL | Status: DC
  Administered 2017-10-24: 240 mg via ORAL
  Filled 2017-10-24: qty 1

## 2017-10-24 MED ORDER — CEPHALEXIN 500 MG PO CAPS: 500.0000 mg | ORAL_CAPSULE | Freq: Three times a day (TID) | ORAL | 0 refills | Status: DC

## 2017-10-24 MED ORDER — METOPROLOL TARTRATE 25 MG PO TABS
100.0000 mg | ORAL_TABLET | Freq: Two times a day (BID) | ORAL | Status: DC
  Administered 2017-10-24: 100 mg via ORAL
  Filled 2017-10-24: qty 4

## 2017-10-24 MED ORDER — CEFTRIAXONE SODIUM 1 G IJ SOLR
1.0000 g | Freq: Once | INTRAMUSCULAR | Status: AC
  Administered 2017-10-24: 1 g via INTRAVENOUS
  Filled 2017-10-24: qty 10

## 2017-10-24 NOTE — Discharge Instructions (Signed)
Do not take coumadin today. Resume at new 2mg /3mg  dosing tomorrow. Recheck INR in 1 week.

## 2017-10-24 NOTE — ED Notes (Signed)
ED Provider at bedside. 

## 2017-10-24 NOTE — ED Notes (Signed)
Patient transported to CT 

## 2017-10-24 NOTE — ED Notes (Signed)
Patient transported to X-ray 

## 2017-10-24 NOTE — ED Notes (Signed)
PurWick applied 

## 2017-10-24 NOTE — ED Triage Notes (Signed)
pts daughter at bedside states pt fell Thursday night trying to get to her bedside commode, pt had another fall last night attempting to get to potty chair again. Pt is on coumadin. No LOC, bruising to bilateral eyes and left forearm. Skin tears present to left arm. Pt alert and oriented, able to move all extremities freely.

## 2017-10-24 NOTE — ED Provider Notes (Signed)
[facility] EMERGENCY DEPARTMENT Provider Note   CSN: [id number] Arrival date & time: 10/24/17  0855     History   Chief Complaint Chief Complaint  Patient presents with  . Fall    HPI Kelsey [name] is a 84 y.o. female.  HPI   84 year old female presenting with family for evaluation after a fall.  History is primarily from her daughter at bedside.  On Thursday night she actually had 2 falls when trying to get her bedside commode.  She did strike her head but no reported loss of consciousness.  Bruising was noted.  No significant change in her mental status.  Last night she rolled out of bed and fell again.  She is complaining of left-sided chest pain since that fall.  She is on Coumadin.  Past Medical History:  Diagnosis Date  . Asthma   . Atrial fibrillation (HCC)   . COPD (chronic obstructive pulmonary disease) (HCC)   . Depression   . GERD (gastroesophageal reflux disease)   . Hyperlipidemia   . Hypothyroidism   . Osteoporosis   . Restless leg syndrome   . Seasonal allergies     Patient Active Problem List   Diagnosis Date Noted  . Atrial fibrillation (HCC) 10/26/2013  . Secondary pulmonary hypertension 10/26/2013  . COPD (chronic obstructive pulmonary disease) (HCC) 10/26/2013  . Essential hypertension 10/26/2013  . Hyperlipidemia 10/26/2013  . Chronic diastolic heart failure (HCC) 10/26/2013    Past Surgical History:  Procedure Laterality Date  . BREAST CYST EXCISION  1970   breast cyst biopsy-benign  . BUNIONECTOMY  08/02   right bunionectomy  . CARDIOVERSION  12/16/2011   Procedure: CARDIOVERSION;  Surgeon: Donato Schultz, MD;  Location: [facility] OR;  Service: Cardiovascular;  Laterality: N/A;  . JOINT REPLACEMENT    . OTHER SURGICAL HISTORY  1995   left cataract removal with IOL  . OTHER SURGICAL HISTORY  2003   right cataract with IOL  . THYROIDECTOMY  1970   for benign tumor  . TOTAL HIP ARTHROPLASTY Left   . TOTAL KNEE ARTHROPLASTY  Bilateral   . VAGINAL HYSTERECTOMY  1992   partial vaginal hysterectomy    OB History    No data available       Home Medications    Prior to Admission medications   Medication Sig Start Date End Date Taking? Authorizing Provider  albuterol (PROVENTIL) (2.5 MG/3ML) 0.083% nebulizer solution Take 2.5 mg by nebulization every 4 (four) hours as needed for wheezing or shortness of breath.     [provider]  cholecalciferol (VITAMIN D) 1000 UNITS tablet Take 2,000 Units by mouth daily.    [provider]  diltiazem (TIAZAC) 240 MG 24 hr capsule Take 240 mg by mouth daily.    [provider]  diphenhydramine-acetaminophen (TYLENOL PM) 25-500 MG TABS tablet Take 1 tablet by mouth at bedtime as needed (sleep).    [provider]  ferrous gluconate (FERGON) 325 MG tablet Take 325 mg by mouth daily with breakfast.    [provider]  fexofenadine (ALLEGRA) 180 MG tablet Take 180 mg by mouth daily. For allergies    [provider]  FLUoxetine (PROZAC) 20 MG capsule Take 20 mg by mouth daily.    [provider]  furosemide (LASIX) 20 MG tablet Take 20 mg by mouth daily.    [provider]  ipratropium (ATROVENT HFA) 17 MCG/ACT inhaler Inhale 2 puffs into the lungs every 6 (six) hours.  [provider]  losartan (COZAAR) 25 MG tablet Take 25 mg by mouth daily.    [provider]  metoprolol (LOPRESSOR) 100 MG tablet Take 100 mg by mouth 2 (two) times daily.    [provider]  omeprazole (PRILOSEC) 20 MG capsule Take 20 mg by mouth daily.    [provider]  senna-docusate (SENOKOT-S) 8.6-50 MG tablet Take 1 tablet by mouth at bedtime as needed for mild constipation or moderate constipation.    [provider]  simvastatin (ZOCOR) 5 MG tablet Take 5 mg by mouth at bedtime.    [provider]  traMADol-acetaminophen (ULTRACET) 37.5-325 MG tablet Take 1 tablet by mouth every  12 (twelve) hours as needed. 02/18/16   Mancel Bale, MD  warfarin (COUMADIN) 5 MG tablet Take 5 mg by mouth daily. 5mg  Sunday through Thursday and 4mg  Friday and Saturday (as of 02/18/16).    [provider]    Family History No family history on file.  Social History Social History   Tobacco Use  . Smoking status: Former Games developer  . Tobacco comment: QUIT  Substance Use Topics  . Alcohol use: No  . Drug use: Not on file     Allergies   Codeine; Morphine and related; and Promethazine   Review of Systems Review of Systems  All systems reviewed and negative, other than as noted in HPI.  Physical Exam Updated Vital Signs BP 125/84   Pulse (!) 125   Resp 20   SpO2 97%   Physical Exam  Constitutional: She appears well-developed and well-nourished. No distress.  HENT:  Head: Normocephalic and atraumatic.  Eyes: Conjunctivae are normal. Right eye exhibits no discharge. Left eye exhibits no discharge.  Neck: Neck supple.  Cardiovascular: Normal heart sounds. Exam reveals no gallop and no friction rub.  No murmur heard. Tachycardic with irregularly irregular rhythm  Pulmonary/Chest: Effort normal and breath sounds normal. No respiratory distress.  Abdominal: Soft. She exhibits no distension. There is no tenderness.  Musculoskeletal: She exhibits no edema or tenderness.  Tenderness to palpation over the lateral chest wall on the left.  No overlying skin changes.  No crepitance.  There is some bruising noted to the dorsum of the left foot.  No apparent pain with range of motion she denies any significant pain with palpation or ranging.  Both feet are very cold to touch but she does have palpable DP pulses.  Raccoon eyes with ecchymoses extending up into the left frontal region with a small hematoma.  Neurological:  Drowsy, but opens eyes to voice.  Answers questions appropriately.  No focal deficits noted.  Skin: Skin is dry.  Psychiatric: She has a normal mood and  affect. Her behavior is normal. Thought content normal.  Nursing note and vitals reviewed.    ED Treatments / Results  Labs (all labs ordered are listed, but only abnormal results are displayed) Labs Reviewed  CBC WITH DIFFERENTIAL/PLATELET - Abnormal; Notable for the following components:      Result Value   WBC 20.7 (*)    RDW 15.7 (*)    Neutro Abs 18.3 (*)    Lymphs Abs 0.6 (*)    Monocytes Absolute 1.7 (*)    All other components within normal limits  BASIC METABOLIC PANEL - Abnormal; Notable for the following components:   Glucose, Bld 103 (*)    BUN 42 (*)    Calcium 8.2 (*)    GFR calc non Af Amer 59 (*)  All other components within normal limits  PROTIME-INR - Abnormal; Notable for the following components:   Prothrombin Time 37.5 (*)    All other components within normal limits  URINALYSIS, ROUTINE W REFLEX MICROSCOPIC - Abnormal; Notable for the following components:   Color, Urine AMBER (*)    Ketones, ur 5 (*)    Protein, ur 100 (*)    Leukocytes, UA SMALL (*)    Bacteria, UA MANY (*)    Squamous Epithelial / LPF 6-30 (*)    All other components within normal limits  I-STAT CG4 LACTIC ACID, ED - Abnormal; Notable for the following components:   Lactic Acid, Venous 2.47 (*)    All other components within normal limits  URINE CULTURE    EKG  EKG Interpretation  Date/Time:  Saturday October 24 2017 09:06:44 EST Ventricular Rate:  135 PR Interval:    QRS Duration: 78 QT Interval:  298 QTC Calculation: 460 R Axis:   90 Text Interpretation:  Atrial fibrillation with rapid V-rate Borderline right axis deviation Repolarization abnormality, prob rate related Confirmed by Raeford RazorKohut, Trayveon Beckford [phone]([id number]) on 10/24/2017 9:49:24 AM       Radiology No results found.  Procedures Procedures (including critical care time)  Medications Ordered in ED Medications - No data to display   Initial Impression / Assessment and Plan / ED Course  I have reviewed the triage  vital signs and the nursing notes.  Pertinent labs & imaging results that were available during my care of the patient were reviewed by me and considered in my medical decision making (see chart for details).     84 year old female with fall on Coumadin.  Daughter reports that her INR has been elevated up to 3.1 recently, probably related to recently been on azithromycin for upper respiratory symptoms.  She has evidence of head/facial trauma but appears to be at her neurologic baseline.  Will CT.  Plain films of the left chest given her tenderness and complaint of pain.  She does have some bruising to her left foot as well but denies any significant bony tenderness or pain with range of motion.  Will check basic labs and INR.  Family reports that she has not been quite her normal self since about the holidays since she has been dealing with a respiratory infection.  We will also check a UA although she has no acute urinary complaints.  Offered pain medicine, but declined.  She is aware that she can request it if she should change her mind.  She is tachycardic.  History of A. fib which is why she is on the Coumadin.  Rates up to 120-130s.  She has not had her medicines today.  Home doses of metoprolol and diltiazem were ordered.  Patient has a white count of 20,000.  She was started on steroids this week for respiratory illness.  She is afebrile.  I doubt infectious etiology.  Imaging negative.  Appropriate for outpatient treatment.  Advised to hold Coumadin today and resume tomorrow.  Repeat INR in 1 week.  Return precautions were discussed. Final Clinical Impressions(s) / ED Diagnoses   Final diagnoses:  Fall, initial encounter  Anticoagulated on Coumadin  Multiple contusions  Urinary tract infection without hematuria, site unspecified    ED Discharge Orders    None      Raeford RazorKohut, Larance Ratledge, MD 10/24/17 1530

## 2017-10-26 LAB — URINE CULTURE: Culture: >=100000 — AB

## 2017-10-27 ENCOUNTER — Telehealth: Payer: Self-pay | Admitting: *Deleted

## 2017-10-27 NOTE — Telephone Encounter (Signed)
Post ED Visit - Positive Culture Follow-up  Culture report reviewed by antimicrobial stewardship pharmacist:  [x]  Enzo BiNathan Batchelder, Pharm.D. []  Celedonio MiyamotoJeremy Frens, Pharm.D., BCPS AQ-ID []  Garvin FilaMike Maccia, Pharm.D., BCPS []  Georgina PillionElizabeth Martin, Pharm.D., BCPS []  ShilohMinh Pham, VermontPharm.D., BCPS, AAHIVP []  Estella HuskMichelle Turner, Pharm.D., BCPS, AAHIVP []  Lysle Pearlachel Rumbarger, PharmD, BCPS []  Blake DivineShannon Parkey, PharmD []  Pollyann SamplesAndy Johnston, PharmD, BCPS  Positive urine culture Treated with cephalexin, organism sensitive to the same and no further patient follow-up is required at this time.  Virl AxeRobertson, Bayan Hedstrom [facility] 10/27/2017, 10:17 AM

## 2017-11-01 ENCOUNTER — Inpatient Hospital Stay (HOSPITAL_COMMUNITY)
Admission: EM | Admit: 2017-11-01 | Discharge: 2017-11-13 | DRG: 871 | Disposition: E | Payer: Medicare Other | Attending: Internal Medicine | Admitting: Internal Medicine

## 2017-11-01 ENCOUNTER — Emergency Department (HOSPITAL_COMMUNITY): Payer: Medicare Other

## 2017-11-01 ENCOUNTER — Encounter (HOSPITAL_COMMUNITY): Payer: Self-pay | Admitting: Family Medicine

## 2017-11-01 DIAGNOSIS — I5032 Chronic diastolic (congestive) heart failure: Secondary | ICD-10-CM | POA: Diagnosis present

## 2017-11-01 DIAGNOSIS — J449 Chronic obstructive pulmonary disease, unspecified: Secondary | ICD-10-CM | POA: Diagnosis present

## 2017-11-01 DIAGNOSIS — S0083XA Contusion of other part of head, initial encounter: Secondary | ICD-10-CM | POA: Diagnosis present

## 2017-11-01 DIAGNOSIS — Z5181 Encounter for therapeutic drug level monitoring: Secondary | ICD-10-CM | POA: Diagnosis not present

## 2017-11-01 DIAGNOSIS — Z96653 Presence of artificial knee joint, bilateral: Secondary | ICD-10-CM | POA: Diagnosis present

## 2017-11-01 DIAGNOSIS — S22019A Unspecified fracture of first thoracic vertebra, initial encounter for closed fracture: Secondary | ICD-10-CM | POA: Diagnosis present

## 2017-11-01 DIAGNOSIS — E872 Acidosis: Secondary | ICD-10-CM | POA: Diagnosis present

## 2017-11-01 DIAGNOSIS — E875 Hyperkalemia: Secondary | ICD-10-CM | POA: Diagnosis present

## 2017-11-01 DIAGNOSIS — G9340 Encephalopathy, unspecified: Secondary | ICD-10-CM | POA: Diagnosis present

## 2017-11-01 DIAGNOSIS — E785 Hyperlipidemia, unspecified: Secondary | ICD-10-CM | POA: Diagnosis present

## 2017-11-01 DIAGNOSIS — M810 Age-related osteoporosis without current pathological fracture: Secondary | ICD-10-CM | POA: Diagnosis present

## 2017-11-01 DIAGNOSIS — I272 Pulmonary hypertension, unspecified: Secondary | ICD-10-CM | POA: Diagnosis present

## 2017-11-01 DIAGNOSIS — E870 Hyperosmolality and hypernatremia: Secondary | ICD-10-CM | POA: Diagnosis present

## 2017-11-01 DIAGNOSIS — K219 Gastro-esophageal reflux disease without esophagitis: Secondary | ICD-10-CM | POA: Diagnosis present

## 2017-11-01 DIAGNOSIS — Z66 Do not resuscitate: Secondary | ICD-10-CM | POA: Diagnosis present

## 2017-11-01 DIAGNOSIS — R410 Disorientation, unspecified: Secondary | ICD-10-CM

## 2017-11-01 DIAGNOSIS — Z96642 Presence of left artificial hip joint: Secondary | ICD-10-CM | POA: Diagnosis present

## 2017-11-01 DIAGNOSIS — Z961 Presence of intraocular lens: Secondary | ICD-10-CM | POA: Diagnosis present

## 2017-11-01 DIAGNOSIS — Z87891 Personal history of nicotine dependence: Secondary | ICD-10-CM

## 2017-11-01 DIAGNOSIS — R296 Repeated falls: Secondary | ICD-10-CM

## 2017-11-01 DIAGNOSIS — I959 Hypotension, unspecified: Secondary | ICD-10-CM | POA: Diagnosis not present

## 2017-11-01 DIAGNOSIS — Z9842 Cataract extraction status, left eye: Secondary | ICD-10-CM

## 2017-11-01 DIAGNOSIS — A419 Sepsis, unspecified organism: Secondary | ICD-10-CM | POA: Diagnosis not present

## 2017-11-01 DIAGNOSIS — I4891 Unspecified atrial fibrillation: Secondary | ICD-10-CM | POA: Diagnosis not present

## 2017-11-01 DIAGNOSIS — Z7901 Long term (current) use of anticoagulants: Secondary | ICD-10-CM

## 2017-11-01 DIAGNOSIS — W19XXXA Unspecified fall, initial encounter: Secondary | ICD-10-CM | POA: Diagnosis present

## 2017-11-01 DIAGNOSIS — J181 Lobar pneumonia, unspecified organism: Secondary | ICD-10-CM | POA: Diagnosis present

## 2017-11-01 DIAGNOSIS — J189 Pneumonia, unspecified organism: Secondary | ICD-10-CM | POA: Diagnosis not present

## 2017-11-01 DIAGNOSIS — E890 Postprocedural hypothyroidism: Secondary | ICD-10-CM | POA: Diagnosis present

## 2017-11-01 DIAGNOSIS — F0390 Unspecified dementia without behavioral disturbance: Secondary | ICD-10-CM | POA: Diagnosis present

## 2017-11-01 DIAGNOSIS — R627 Adult failure to thrive: Secondary | ICD-10-CM | POA: Diagnosis present

## 2017-11-01 DIAGNOSIS — I482 Chronic atrial fibrillation: Secondary | ICD-10-CM | POA: Diagnosis present

## 2017-11-01 DIAGNOSIS — G9341 Metabolic encephalopathy: Secondary | ICD-10-CM | POA: Diagnosis present

## 2017-11-01 DIAGNOSIS — N179 Acute kidney failure, unspecified: Secondary | ICD-10-CM | POA: Diagnosis present

## 2017-11-01 DIAGNOSIS — J440 Chronic obstructive pulmonary disease with acute lower respiratory infection: Secondary | ICD-10-CM | POA: Diagnosis present

## 2017-11-01 DIAGNOSIS — R6251 Failure to thrive (child): Secondary | ICD-10-CM

## 2017-11-01 DIAGNOSIS — Z90711 Acquired absence of uterus with remaining cervical stump: Secondary | ICD-10-CM

## 2017-11-01 DIAGNOSIS — Z6828 Body mass index (BMI) 28.0-28.9, adult: Secondary | ICD-10-CM

## 2017-11-01 DIAGNOSIS — Z888 Allergy status to other drugs, medicaments and biological substances status: Secondary | ICD-10-CM

## 2017-11-01 DIAGNOSIS — J90 Pleural effusion, not elsewhere classified: Secondary | ICD-10-CM

## 2017-11-01 DIAGNOSIS — Z515 Encounter for palliative care: Secondary | ICD-10-CM | POA: Diagnosis not present

## 2017-11-01 DIAGNOSIS — R630 Anorexia: Secondary | ICD-10-CM | POA: Diagnosis present

## 2017-11-01 DIAGNOSIS — Z885 Allergy status to narcotic agent status: Secondary | ICD-10-CM

## 2017-11-01 DIAGNOSIS — K449 Diaphragmatic hernia without obstruction or gangrene: Secondary | ICD-10-CM | POA: Diagnosis present

## 2017-11-01 DIAGNOSIS — Z7189 Other specified counseling: Secondary | ICD-10-CM

## 2017-11-01 DIAGNOSIS — Z9181 History of falling: Secondary | ICD-10-CM

## 2017-11-01 DIAGNOSIS — I10 Essential (primary) hypertension: Secondary | ICD-10-CM | POA: Diagnosis present

## 2017-11-01 DIAGNOSIS — T148XXA Other injury of unspecified body region, initial encounter: Secondary | ICD-10-CM

## 2017-11-01 DIAGNOSIS — Z9841 Cataract extraction status, right eye: Secondary | ICD-10-CM

## 2017-11-01 DIAGNOSIS — E860 Dehydration: Secondary | ICD-10-CM | POA: Diagnosis present

## 2017-11-01 DIAGNOSIS — E0590 Thyrotoxicosis, unspecified without thyrotoxic crisis or storm: Secondary | ICD-10-CM | POA: Diagnosis not present

## 2017-11-01 DIAGNOSIS — F329 Major depressive disorder, single episode, unspecified: Secondary | ICD-10-CM | POA: Diagnosis present

## 2017-11-01 DIAGNOSIS — I110 Hypertensive heart disease with heart failure: Secondary | ICD-10-CM | POA: Diagnosis present

## 2017-11-01 LAB — T4, FREE: FREE T4: 0.9 ng/dL (ref 0.61–1.12)

## 2017-11-01 LAB — I-STAT CG4 LACTIC ACID, ED
LACTIC ACID, VENOUS: 3.1 mmol/L — AB (ref 0.5–1.9)
LACTIC ACID, VENOUS: 3.13 mmol/L — AB (ref 0.5–1.9)

## 2017-11-01 LAB — URINALYSIS, ROUTINE W REFLEX MICROSCOPIC
BILIRUBIN URINE: NEGATIVE
Bacteria, UA: NONE SEEN
GLUCOSE, UA: NEGATIVE mg/dL
Hgb urine dipstick: NEGATIVE
Ketones, ur: NEGATIVE mg/dL
LEUKOCYTES UA: NEGATIVE
Nitrite: NEGATIVE
PH: 5 (ref 5.0–8.0)
Protein, ur: 100 mg/dL — AB
Specific Gravity, Urine: 1.027 (ref 1.005–1.030)

## 2017-11-01 LAB — PROCALCITONIN: PROCALCITONIN: 0.11 ng/mL

## 2017-11-01 LAB — CBC WITH DIFFERENTIAL/PLATELET
BASOS ABS: 0 10*3/uL (ref 0.0–0.1)
BASOS PCT: 0 %
Eosinophils Absolute: 0 10*3/uL (ref 0.0–0.7)
Eosinophils Relative: 0 %
HEMATOCRIT: 43.5 % (ref 36.0–46.0)
Hemoglobin: 13.8 g/dL (ref 12.0–15.0)
LYMPHS PCT: 7 %
Lymphs Abs: 1.3 10*3/uL (ref 0.7–4.0)
MCH: 31.6 pg (ref 26.0–34.0)
MCHC: 31.7 g/dL (ref 30.0–36.0)
MCV: 99.5 fL (ref 78.0–100.0)
MONO ABS: 1.3 10*3/uL — AB (ref 0.1–1.0)
MONOS PCT: 7 %
NEUTROS ABS: 16.1 10*3/uL — AB (ref 1.7–7.7)
Neutrophils Relative %: 86 %
PLATELETS: 188 10*3/uL (ref 150–400)
RBC: 4.37 MIL/uL (ref 3.87–5.11)
RDW: 18.7 % — AB (ref 11.5–15.5)
WBC: 18.7 10*3/uL — ABNORMAL HIGH (ref 4.0–10.5)

## 2017-11-01 LAB — COMPREHENSIVE METABOLIC PANEL
ALT: 25 U/L (ref 14–54)
AST: 59 U/L — ABNORMAL HIGH (ref 15–41)
Albumin: 3.2 g/dL — ABNORMAL LOW (ref 3.5–5.0)
Alkaline Phosphatase: 117 U/L (ref 38–126)
Anion gap: 14 (ref 5–15)
BILIRUBIN TOTAL: 1.9 mg/dL — AB (ref 0.3–1.2)
BUN: 42 mg/dL — AB (ref 6–20)
CHLORIDE: 111 mmol/L (ref 101–111)
CO2: 22 mmol/L (ref 22–32)
Calcium: 8.6 mg/dL — ABNORMAL LOW (ref 8.9–10.3)
Creatinine, Ser: 1.05 mg/dL — ABNORMAL HIGH (ref 0.44–1.00)
GFR, EST AFRICAN AMERICAN: 51 mL/min — AB (ref >60)
GFR, EST NON AFRICAN AMERICAN: 44 mL/min — AB (ref >60)
Glucose, Bld: 93 mg/dL (ref 65–99)
POTASSIUM: 5.9 mmol/L — AB (ref 3.5–5.1)
Sodium: 147 mmol/L — ABNORMAL HIGH (ref 135–145)
TOTAL PROTEIN: 6.2 g/dL — AB (ref 6.5–8.1)

## 2017-11-01 LAB — LACTIC ACID, PLASMA: Lactic Acid, Venous: 1.9 mmol/L (ref 0.5–1.9)

## 2017-11-01 LAB — CBG MONITORING, ED: GLUCOSE-CAPILLARY: 99 mg/dL (ref 65–99)

## 2017-11-01 LAB — PROTIME-INR
INR: 2.62
PROTHROMBIN TIME: 27.8 s — AB (ref 11.4–15.2)

## 2017-11-01 LAB — CK: Total CK: 187 U/L (ref 38–234)

## 2017-11-01 LAB — TSH: TSH: 0.547 u[IU]/mL (ref 0.350–4.500)

## 2017-11-01 MED ORDER — DILTIAZEM HCL-DEXTROSE 100-5 MG/100ML-% IV SOLN (PREMIX)
5.0000 mg/h | INTRAVENOUS | Status: DC
  Administered 2017-11-01: 5 mg/h via INTRAVENOUS
  Administered 2017-11-02: 12.5 mg/h via INTRAVENOUS
  Administered 2017-11-02 – 2017-11-04 (×4): 14 mg/h via INTRAVENOUS
  Filled 2017-11-01 (×8): qty 100

## 2017-11-01 MED ORDER — MORPHINE SULFATE (PF) 4 MG/ML IV SOLN
1.0000 mg | INTRAVENOUS | Status: DC | PRN
  Administered 2017-11-01 – 2017-11-02 (×5): 1 mg via INTRAVENOUS
  Administered 2017-11-03: 2 mg via INTRAVENOUS
  Filled 2017-11-01 (×7): qty 1

## 2017-11-01 MED ORDER — AZITHROMYCIN 500 MG IV SOLR
500.0000 mg | INTRAVENOUS | Status: DC
  Administered 2017-11-01 – 2017-11-03 (×3): 500 mg via INTRAVENOUS
  Filled 2017-11-01 (×4): qty 500

## 2017-11-01 MED ORDER — FLUOXETINE HCL 20 MG PO CAPS
20.0000 mg | ORAL_CAPSULE | Freq: Every day | ORAL | Status: DC
  Filled 2017-11-01: qty 1

## 2017-11-01 MED ORDER — SODIUM CHLORIDE 0.9 % IV BOLUS (SEPSIS)
500.0000 mL | Freq: Once | INTRAVENOUS | Status: AC
  Administered 2017-11-01: 500 mL via INTRAVENOUS

## 2017-11-01 MED ORDER — PROSIGHT PO TABS
1.0000 | ORAL_TABLET | Freq: Every day | ORAL | Status: DC
  Filled 2017-11-01: qty 1

## 2017-11-01 MED ORDER — SODIUM CHLORIDE 0.9 % IV SOLN
INTRAVENOUS | Status: AC
  Administered 2017-11-01: 21:00:00 via INTRAVENOUS

## 2017-11-01 MED ORDER — METOPROLOL TARTRATE 5 MG/5ML IV SOLN
2.5000 mg | Freq: Once | INTRAVENOUS | Status: AC
  Administered 2017-11-01: 2.5 mg via INTRAVENOUS
  Filled 2017-11-01: qty 5

## 2017-11-01 MED ORDER — POLYVINYL ALCOHOL 1.4 % OP SOLN
1.0000 [drp] | Freq: Every day | OPHTHALMIC | Status: DC | PRN
  Filled 2017-11-01: qty 15

## 2017-11-01 MED ORDER — ONDANSETRON HCL 4 MG/2ML IJ SOLN: 4.0000 mg | Freq: Four times a day (QID) | INTRAMUSCULAR | Status: DC | PRN

## 2017-11-01 MED ORDER — SIMVASTATIN 10 MG PO TABS
10.0000 mg | ORAL_TABLET | Freq: Every day | ORAL | Status: DC
  Filled 2017-11-01: qty 1

## 2017-11-01 MED ORDER — MORPHINE SULFATE (PF) 4 MG/ML IV SOLN
2.0000 mg | Freq: Once | INTRAVENOUS | Status: AC
  Administered 2017-11-01: 2 mg via INTRAVENOUS
  Filled 2017-11-01: qty 1

## 2017-11-01 MED ORDER — SODIUM CHLORIDE 0.9 % IV BOLUS (SEPSIS)
1000.0000 mL | Freq: Once | INTRAVENOUS | Status: AC
  Administered 2017-11-01: 1000 mL via INTRAVENOUS

## 2017-11-01 MED ORDER — ACETAMINOPHEN 325 MG PO TABS: 650.0000 mg | ORAL_TABLET | ORAL | Status: DC | PRN

## 2017-11-01 MED ORDER — LORAZEPAM 2 MG/ML IJ SOLN
0.5000 mg | INTRAMUSCULAR | Status: DC | PRN
  Administered 2017-11-01 – 2017-11-03 (×3): 0.5 mg via INTRAVENOUS
  Filled 2017-11-01 (×3): qty 1

## 2017-11-01 MED ORDER — SODIUM CHLORIDE 0.9 % IV SOLN
Freq: Once | INTRAVENOUS | Status: AC
  Administered 2017-11-01: 18:00:00 via INTRAVENOUS

## 2017-11-01 MED ORDER — ALBUTEROL SULFATE (2.5 MG/3ML) 0.083% IN NEBU
2.5000 mg | INHALATION_SOLUTION | RESPIRATORY_TRACT | Status: DC | PRN
  Administered 2017-11-01: 2.5 mg via RESPIRATORY_TRACT
  Filled 2017-11-01: qty 3

## 2017-11-01 MED ORDER — CEFTRIAXONE SODIUM 1 G IJ SOLR
1.0000 g | INTRAMUSCULAR | Status: DC
  Administered 2017-11-01 – 2017-11-03 (×3): 1 g via INTRAVENOUS
  Filled 2017-11-01 (×4): qty 10

## 2017-11-01 NOTE — ED Notes (Signed)
ED Provider at bedside. 

## 2017-11-01 NOTE — ED Notes (Signed)
Home health aid at bedside.

## 2017-11-01 NOTE — ED Notes (Signed)
Phleb at bedside  

## 2017-11-01 NOTE — ED Notes (Signed)
Cervical collar applied

## 2017-11-01 NOTE — ED Notes (Signed)
Patient transported to CT 

## 2017-11-01 NOTE — ED Notes (Signed)
In and out cath performed by this RN for urine specimens. Prom, ED tech as witness.

## 2017-11-01 NOTE — ED Triage Notes (Signed)
Pt BIB EMS from home for increased AMS this week. LSN Monday. Per EMS pt home health nurse reports multiple falls resulting in cracked ribs, hematoma to head, and possible UTI; per EMS pt normally A&O; pt grunting, not answering questions or following commands. Pt on coumadin, and in afib with rates between 125-145 at this time.

## 2017-11-01 NOTE — H&P (Signed)
History and Physical    Kelsey [name] ZOX:[id number] DOB: 04/19/1922 DOA: November 11, 2017  PCP: Maurice Small, MD   Patient coming from: Home  Chief Complaint: Multiple falls, not eating, lethargy, increased confusion   HPI: Kelsey [name] is a 84 y.o. female with medical history significant for atrial fibrillation on Coumadin, COPD, thyroid disease, hypertension, and depression, now presenting to the emergency department for evaluation of increased confusion, lethargy, food refusal, and multiple falls.  Patient lives at home where she is cared for by a home health aide.  She has had multiple falls over the past couple weeks and has been increasingly confused, refusing food or drink, and less active for the past 4-5 days.  Per the home health aide at the bedside, patient is able to answer yes or no at her baseline and can transfer from bed to bedside commode, but has not been doing this the past couple days.  She has been groaning in apparent pain.  Patient was recently treated with Ceftin for a UTI and had completed the course.  ED Course: Upon arrival to the ED, patient is found to be afebrile, saturating low 90s on room air tachycardic in the 140s, and with initial blood pressure of 90/56.  EKG features atrial fibrillation with RVR, rate 141.  Chest x-ray is notable for left pleural effusion with left lower lobe atelectasis versus pneumonia.  Radiographs of the lower legs are negative for fracture.  Head CT negative for acute intracranial abnormality.  Cervical spine CT negative for acute cervical pathology, but reveals a possible acute T1 fracture versus artifact.  CT chest reveals a large loculated left pleural effusion and possible focus of infection in the right lung.  CT of the abdomen and pelvis is a large hiatal hernia, but no acute findings.  Chemistry panel reveals a sodium of 147, potassium 5.9, and bilirubin 1.9.  CBC features a leukocytosis to 18,700.  Lactic acid is elevated to 3.10, INR  is therapeutic at 2.62, and urinalysis is unremarkable serum CK is normal.  Blood and urine cultures were collected and the patient was treated with 1.5 L of normal saline, morphine pressor.  Remained in atrial fibrillation with RVR, appears to be in pain and distress, and will be admitted to the stepdown unit for ongoing evaluation and management of atrial fibrillation with RVR, acute encephalopathy with recurrent falls on Coumadin, and possible pneumonia.  Review of Systems:  Unable to complete ROS secondary to patient's clinical condition.  Past Medical History:  Diagnosis Date  . Asthma   . Atrial fibrillation (HCC)   . COPD (chronic obstructive pulmonary disease) (HCC)   . Depression   . GERD (gastroesophageal reflux disease)   . Hyperlipidemia   . Hypothyroidism   . Osteoporosis   . Restless leg syndrome   . Seasonal allergies     Past Surgical History:  Procedure Laterality Date  . BREAST CYST EXCISION  1970   breast cyst biopsy-benign  . BUNIONECTOMY  08/02   right bunionectomy  . CARDIOVERSION  12/16/2011   Procedure: CARDIOVERSION;  Surgeon: Donato Schultz, MD;  Location: [facility] OR;  Service: Cardiovascular;  Laterality: N/A;  . JOINT REPLACEMENT    . OTHER SURGICAL HISTORY  1995   left cataract removal with IOL  . OTHER SURGICAL HISTORY  2003   right cataract with IOL  . THYROIDECTOMY  1970   for benign tumor  . TOTAL HIP ARTHROPLASTY Left   . TOTAL KNEE ARTHROPLASTY Bilateral   .  VAGINAL HYSTERECTOMY  1992   partial vaginal hysterectomy     reports that she has quit smoking. she has never used smokeless tobacco. She reports that she does not drink alcohol. Her drug history is not on file.  Allergies  Allergen Reactions  . Codeine Nausea And Vomiting  . Morphine And Related Nausea Only  . Promethazine Other (See Comments)    Hallucinations   . Tape Other (See Comments)    SKIN IS VERY THIN AND WILL BRUISE AND TEAR EASILY; PLEASE USE PAPER TAPE!!    Family  History  Problem Relation Age of Onset  . Sudden Cardiac Death Neg Hx      Prior to Admission medications   Medication Sig Start Date End Date Taking? Authorizing Provider  acetaminophen (TYLENOL) 650 MG CR tablet Take 650 mg by mouth 2 (two) times daily with breakfast and lunch.    Yes [provider]  albuterol (PROVENTIL) (2.5 MG/3ML) 0.083% nebulizer solution Take 2.5 mg by nebulization every 4 (four) hours as needed for wheezing or shortness of breath.    Yes [provider]  Cholecalciferol (VITAMIN D-3 PO) Take 1 capsule by mouth daily with lunch.   Yes [provider]  diltiazem (TIAZAC) 240 MG 24 hr capsule Take 240 mg by mouth daily.   Yes [provider]  ECHINACEA PO Take 1 capsule by mouth daily.   Yes [provider]  FLUoxetine (PROZAC) 20 MG capsule Take 20 mg by mouth daily.   Yes [provider]  furosemide (LASIX) 20 MG tablet Take 20 mg by mouth daily.   Yes [provider]  HYDROcodone-acetaminophen (NORCO/VICODIN) 5-325 MG tablet Take 1 tablet by mouth every 6 (six) hours as needed for moderate pain.   Yes [provider]  losartan (COZAAR) 25 MG tablet Take 25 mg by mouth daily.   Yes [provider]  methimazole (TAPAZOLE) 5 MG tablet Take 2.5 mg by mouth 3 (three) times a week.   Yes [provider]  metoprolol succinate (TOPROL-XL) 100 MG 24 hr tablet Take 100 mg by mouth 2 (two) times daily. Take with or immediately following a meal.   Yes [provider]  Multiple Vitamins-Minerals (PRESERVISION AREDS 2+MULTI VIT) CAPS Take 1 capsule by mouth daily.    Yes [provider]  NON FORMULARY Swiss Kriss laxarive tablets: Take 1 tablet by mouth once a day as needed for constipation   Yes [provider]  omeprazole (PRILOSEC) 20 MG capsule Take 20 mg by mouth daily.   Yes [provider]  Polyethyl Glyc-Propyl Glyc PF (SYSTANE PRESERVATIVE FREE)  0.4-0.3 % SOLN Place 1 drop into both eyes daily as needed (for dryness).    Yes [provider]  simvastatin (ZOCOR) 10 MG tablet Take 10 mg by mouth at bedtime.   Yes [provider]  warfarin (COUMADIN) 2 MG tablet Take 2-3 mg by mouth See admin instructions. 3 mg by mouth at 4 PM daily on Sun/Tues/Thurs/Sat and 2 mg on Mon/Wed/Fri   Yes [provider]    Physical Exam: Vitals:   11/02/2017 1851 10/21/2017 1915 11/02/2017 1931 10/31/2017 2015  BP: (!) 90/56 (!) 105/54 (!) 97/56 115/63  Pulse: 100 95 (!) 108 82  Resp: 19 16 18 18   Temp:      TempSrc:      SpO2: 95% 92% 90% 91%      Constitutional: No acute respiratory distress. Writhing in bed, in obvious discomfort Eyes: PERTLA, lids  and conjunctivae normal ENMT: Mucous membranes are moist. Posterior pharynx clear of any exudate or lesions.   Neck: normal, supple, no masses, no thyromegaly Respiratory: Diminished bilaterally L>R, scattered rhonchi. No accessory muscle use.  Cardiovascular: Rate ~120 and irregular. No significant JVD. Abdomen: No distension, no tenderness, no masses palpated. Bowel sounds normal.  Musculoskeletal: no clubbing / cyanosis. No joint deformity upper and lower extremities.    Skin: no significant rashes, lesions, ulcers. Ecchymoses and hematoma about the face, chest, and extremities. Neurologic: No facial asymmetry, PERRL. Sensation intact. Moving all extremities spontaneously.  Psychiatric: Difficult to assess given the clinical scenario.     Labs on Admission: I have personally reviewed following labs and imaging studies  CBC: Recent Labs  Lab 2017-11-03 1553  WBC 18.7*  NEUTROABS 16.1*  HGB 13.8  HCT 43.5  MCV 99.5  PLT 188   Basic Metabolic Panel: Recent Labs  Lab 2017-11-03 1545  NA 147*  K 5.9*  CL 111  CO2 22  GLUCOSE 93  BUN 42*  CREATININE 1.05*  CALCIUM 8.6*   GFR: CrCl cannot be calculated (Unknown ideal weight.). Liver Function Tests: Recent Labs    Lab 2017-11-03 1545  AST 59*  ALT 25  ALKPHOS 117  BILITOT 1.9*  PROT 6.2*  ALBUMIN 3.2*   No results for input(s): LIPASE, AMYLASE in the last 168 hours. No results for input(s): AMMONIA in the last 168 hours. Coagulation Profile: Recent Labs  Lab 11/03/17 1545  INR 2.62   Cardiac Enzymes: Recent Labs  Lab 03-Nov-2017 1545  CKTOTAL 187   BNP (last 3 results) No results for input(s): PROBNP in the last 8760 hours. HbA1C: No results for input(s): HGBA1C in the last 72 hours. CBG: Recent Labs  Lab November 03, 2017 1547  GLUCAP 99   Lipid Profile: No results for input(s): CHOL, HDL, LDLCALC, TRIG, CHOLHDL, LDLDIRECT in the last 72 hours. Thyroid Function Tests: No results for input(s): TSH, T4TOTAL, FREET4, T3FREE, THYROIDAB in the last 72 hours. Anemia Panel: No results for input(s): VITAMINB12, FOLATE, FERRITIN, TIBC, IRON, RETICCTPCT in the last 72 hours. Urine analysis:    Component Value Date/Time   COLORURINE AMBER (A) 11-03-17 1625   APPEARANCEUR CLEAR 2017/11/03 1625   LABSPEC 1.027 2017-11-03 1625   PHURINE 5.0 11-03-2017 1625   GLUCOSEU NEGATIVE 11-03-2017 1625   HGBUR NEGATIVE 03-Nov-2017 1625   BILIRUBINUR NEGATIVE 11/03/17 1625   KETONESUR NEGATIVE November 03, 2017 1625   PROTEINUR 100 (A) November 03, 2017 1625   NITRITE NEGATIVE 11/03/2017 1625   LEUKOCYTESUR NEGATIVE 2017-11-03 1625   Sepsis Labs: @LABRCNTIP (procalcitonin:4,lacticidven:4) ) Recent Results (from the past 240 hour(s))  Urine culture     Status: Abnormal   Collection Time: 10/24/17  2:08 PM  Result Value Ref Range Status   Specimen Description URINE, CLEAN CATCH  Final   Special Requests NONE  Final   Culture >=100,000 COLONIES/mL ESCHERICHIA COLI (A)  Final   Report Status 10/26/2017 FINAL  Final   Organism ID, Bacteria ESCHERICHIA COLI (A)  Final      Susceptibility   Escherichia coli - MIC*    AMPICILLIN 4 SENSITIVE Sensitive     CEFAZOLIN <=4 SENSITIVE Sensitive     CEFTRIAXONE <=1  SENSITIVE Sensitive     CIPROFLOXACIN <=0.25 SENSITIVE Sensitive     GENTAMICIN <=1 SENSITIVE Sensitive     IMIPENEM <=0.25 SENSITIVE Sensitive     NITROFURANTOIN 64 INTERMEDIATE Intermediate     TRIMETH/SULFA <=20 SENSITIVE Sensitive     AMPICILLIN/SULBACTAM <=2 SENSITIVE Sensitive  PIP/TAZO <=4 SENSITIVE Sensitive     Extended ESBL NEGATIVE Sensitive     * >=100,000 COLONIES/mL ESCHERICHIA COLI     Radiological Exams on Admission: Ct Abdomen Pelvis Wo Contrast  Result Date: 11/26/17 CLINICAL DATA:  Altered mental status, falls, uncooperative patient. EXAM: CT CHEST, ABDOMEN AND PELVIS WITHOUT CONTRAST TECHNIQUE: Multidetector CT imaging of the chest, abdomen and pelvis was performed following the standard protocol without IV contrast. COMPARISON:  Chest radiograph 10/24/2017, 11-26-17, head 120 19 FINDINGS: CT CHEST FINDINGS Cardiovascular: Coronary artery calcification and aortic atherosclerotic calcification. No pericardial effusion. Mediastinum/Nodes: No axillary or supraclavicular adenopathy. No mediastinal hilar adenopathy. Lungs/Pleura: Bilateral moderate pleural effusions. There is linear bands of atelectasis and potential loculated effusion within the lingula. No clear mass lesion. Ground-glass nodule in the RIGHT lower lobe measures 11 mm (image 23, series 3 Musculoskeletal: No evidence of rib fracture. Osteopenia. Severe degenerate changes shoulders. Chronic appearing compression fracture at T2 and T5 CT ABDOMEN AND PELVIS FINDINGS Hepatobiliary: 2 low-density lesions in the liver cannot be characterized without IV contrast. Central RIGHT hepatic lobe measures 12 mm and and LEFT lateral hepatic lobe measuring 9 mm both on image 50, series 3. Gallbladder normal. Pancreas: Pancreas is normal. No ductal dilatation. No pancreatic inflammation. Spleen: Normal spleen Adrenals/urinary tract: Adrenal glands and kidneys are normal. The ureters and bladder normal. Stomach/Bowel: The entire  stomach is contained within a large hiatal hernia which extends into the posterior mediastinum behind the heart and into the LEFT hemithorax. The stomach is inverted such that the cardia is below the antrum (organoaxial volvulus). No evidence of high-grade obstruction. Duodenum small bowel are normal. Colon rectosigmoid colon normal. Vascular/Lymphatic: Abdominal aorta is normal caliber. There is no retroperitoneal or periportal lymphadenopathy. No pelvic lymphadenopathy. Reproductive: Post hysterectomy Other: No free fluid. Musculoskeletal: LEFT hip prosthetic. Severe osteopenia of the bones. Bulky osteophytosis IMPRESSION: Chest Impression: 1. Bilateral pleural effusions. 2. Atelectasis and loculated effusion in the LEFT lung in part related to the large hiatal hernia. No acute findings or malignancy identified. 3. Ground-glass nodule in the RIGHT lower lobe may represent small focus of infection. Low-grade neoplasm cannot be excluded. 4. Chronic compression fracture the upper thoracic spine. Abdomen / Pelvis Impression: 1. Large hiatal hernia with the entire stomach above the diaphragm within a large hernia sac . 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 2. No acute findings in the abdomen pelvis. 3. Two hypodense lesions in liver cannot be characterized. 4. Severe degenerative arthropathy of the lumbar spine. Electronically Signed   By: Genevive Bi M.D.   On: 26-Nov-2017 18:20   Dg Tibia/fibula Left  Result Date: 11/26/2017 CLINICAL DATA:  84 year old female status post multiple falls EXAM: LEFT TIBIA AND FIBULA - 2 VIEW COMPARISON:  Concurrently obtained radiographs of the right tib-fib FINDINGS: Surgical changes of prior total knee arthroplasty without evidence of hardware complication or periprosthetic fracture. No acute fracture or malalignment identified. The bones appear osteopenic. IMPRESSION: Negative. Electronically  Signed   By: Malachy Moan M.D.   On: Nov 26, 2017 18:30   Dg Tibia/fibula Right  Result Date: 11/26/2017 CLINICAL DATA:  84 year old female status post fall EXAM: RIGHT TIBIA AND FIBULA - 2 VIEW COMPARISON:  Concurrently obtained radiographs of the left tib-fib FINDINGS: Surgical changes of prior total knee arthroplasty without evidence of hardware complication. No acute fracture or malalignment. The bones appear osteopenic. Soft tissues are unremarkable. IMPRESSION: Negative. Electronically Signed   By: Vilma Prader  Archer Asa M.D.   On: 11/04/2017 18:30   Ct Head Wo Contrast  Result Date: 11/11/2017 CLINICAL DATA:  Altered mental status.  Multiple falls. EXAM: CT HEAD WITHOUT CONTRAST CT CERVICAL SPINE WITHOUT CONTRAST TECHNIQUE: Multidetector CT imaging of the head and cervical spine was performed following the standard protocol without intravenous contrast. Multiplanar CT image reconstructions of the cervical spine were also generated. COMPARISON:  None. FINDINGS: CT HEAD FINDINGS Brain: No evidence of acute infarction, hemorrhage, hydrocephalus, extra-axial collection or mass lesion/mass effect. Marked brain parenchymal atrophy and white matter microangiopathy. Lacunar infarct in bilateral basal ganglia. Vascular: Calcific atherosclerotic disease at the skull base. Skull: Normal. Negative for fracture or focal lesion. Sinuses/Orbits: No acute finding. Other: Left frontal scalp hematoma. CT CERVICAL SPINE FINDINGS Alignment: 3 mm posterior listhesis of T1 vertebral body. Skull base and vertebrae: Compression deformity of T1 vertebral body, which is poorly visualized due to motion artifact, however suspicious for acute fracture. Soft tissues and spinal canal: No prevertebral fluid or swelling. No visible canal hematoma. Disc levels: Advanced multilevel osteoarthritic changes of the cervical spine. Upper chest: Large left pleural effusion. Other: None. IMPRESSION: No acute intracranial abnormality. Advanced  brain parenchymal atrophy, chronic microvascular disease, chronic bilateral basal ganglia lacunar infarcts. Suspected acute fracture of T1 vertebral body, poorly visualized due to motion artifact, with approximate 30% height loss. 3 mm posterior listhesis of T1 on C7. Multilevel osteoarthritic changes of the cervical spine. Large left pleural effusion. These results were called by telephone at the time of interpretation on 11/07/2017 at 6:22 pm to Dr. Arby Barrette , who verbally acknowledged these results. Electronically Signed   By: Ted Mcalpine M.D.   On: 10/31/2017 18:23   Ct Chest Wo Contrast  Result Date: 10/15/2017 CLINICAL DATA:  Altered mental status, falls, uncooperative patient. EXAM: CT CHEST, ABDOMEN AND PELVIS WITHOUT CONTRAST TECHNIQUE: Multidetector CT imaging of the chest, abdomen and pelvis was performed following the standard protocol without IV contrast. COMPARISON:  Chest radiograph 10/24/2017, 11/04/2017, head 120 19 FINDINGS: CT CHEST FINDINGS Cardiovascular: Coronary artery calcification and aortic atherosclerotic calcification. No pericardial effusion. Mediastinum/Nodes: No axillary or supraclavicular adenopathy. No mediastinal hilar adenopathy. Lungs/Pleura: Bilateral moderate pleural effusions. There is linear bands of atelectasis and potential loculated effusion within the lingula. No clear mass lesion. Ground-glass nodule in the RIGHT lower lobe measures 11 mm (image 23, series 3 Musculoskeletal: No evidence of rib fracture. Osteopenia. Severe degenerate changes shoulders. Chronic appearing compression fracture at T2 and T5 CT ABDOMEN AND PELVIS FINDINGS Hepatobiliary: 2 low-density lesions in the liver cannot be characterized without IV contrast. Central RIGHT hepatic lobe measures 12 mm and and LEFT lateral hepatic lobe measuring 9 mm both on image 50, series 3. Gallbladder normal. Pancreas: Pancreas is normal. No ductal dilatation. No pancreatic inflammation. Spleen: Normal  spleen Adrenals/urinary tract: Adrenal glands and kidneys are normal. The ureters and bladder normal. Stomach/Bowel: The entire stomach is contained within a large hiatal hernia which extends into the posterior mediastinum behind the heart and into the LEFT hemithorax. The stomach is inverted such that the cardia is below the antrum (organoaxial volvulus). No evidence of high-grade obstruction. Duodenum small bowel are normal. Colon rectosigmoid colon normal. Vascular/Lymphatic: Abdominal aorta is normal caliber. There is no retroperitoneal or periportal lymphadenopathy. No pelvic lymphadenopathy. Reproductive: Post hysterectomy Other: No free fluid. Musculoskeletal: LEFT hip prosthetic. Severe osteopenia of the bones. Bulky osteophytosis IMPRESSION: Chest Impression: 1. Bilateral pleural effusions. 2. Atelectasis and loculated effusion in the LEFT lung in part related  to the large hiatal hernia. No acute findings or malignancy identified. 3. Ground-glass nodule in the RIGHT lower lobe may represent small focus of infection. Low-grade neoplasm cannot be excluded. 4. Chronic compression fracture the upper thoracic spine. Abdomen / Pelvis Impression: 1. Large hiatal hernia with the entire stomach above the diaphragm within a large hernia sac . 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 2. No acute findings in the abdomen pelvis. 3. Two hypodense lesions in liver cannot be characterized. 4. Severe degenerative arthropathy of the lumbar spine. Electronically Signed   By: Genevive Bi M.D.   On: 11/12/2017 18:20   Ct Cervical Spine Wo Contrast  Result Date: 10/22/2017 CLINICAL DATA:  Altered mental status.  Multiple falls. EXAM: CT HEAD WITHOUT CONTRAST CT CERVICAL SPINE WITHOUT CONTRAST TECHNIQUE: Multidetector CT imaging of the head and cervical spine was performed following the standard protocol without intravenous contrast. Multiplanar  CT image reconstructions of the cervical spine were also generated. COMPARISON:  None. FINDINGS: CT HEAD FINDINGS Brain: No evidence of acute infarction, hemorrhage, hydrocephalus, extra-axial collection or mass lesion/mass effect. Marked brain parenchymal atrophy and white matter microangiopathy. Lacunar infarct in bilateral basal ganglia. Vascular: Calcific atherosclerotic disease at the skull base. Skull: Normal. Negative for fracture or focal lesion. Sinuses/Orbits: No acute finding. Other: Left frontal scalp hematoma. CT CERVICAL SPINE FINDINGS Alignment: 3 mm posterior listhesis of T1 vertebral body. Skull base and vertebrae: Compression deformity of T1 vertebral body, which is poorly visualized due to motion artifact, however suspicious for acute fracture. Soft tissues and spinal canal: No prevertebral fluid or swelling. No visible canal hematoma. Disc levels: Advanced multilevel osteoarthritic changes of the cervical spine. Upper chest: Large left pleural effusion. Other: None. IMPRESSION: No acute intracranial abnormality. Advanced brain parenchymal atrophy, chronic microvascular disease, chronic bilateral basal ganglia lacunar infarcts. Suspected acute fracture of T1 vertebral body, poorly visualized due to motion artifact, with approximate 30% height loss. 3 mm posterior listhesis of T1 on C7. Multilevel osteoarthritic changes of the cervical spine. Large left pleural effusion. These results were called by telephone at the time of interpretation on 11/06/2017 at 6:22 pm to Dr. Arby Barrette , who verbally acknowledged these results. Electronically Signed   By: Ted Mcalpine M.D.   On: 11/03/2017 18:23   Dg Chest Portable 1 View  Result Date: 11/02/2017 CLINICAL DATA:  Increased altered mental status. EXAM: PORTABLE CHEST 1 VIEW COMPARISON:  10/24/2017 FINDINGS: Enlarged cardiac silhouette. Calcific atherosclerotic disease of the aorta. Mediastinal contours appear intact. There is no evidence of  focal pneumothorax. Interval development of left pleural effusion and left lower lobe airspace consolidation versus atelectasis. Osseous structures are without acute abnormality. Soft tissues are grossly normal. IMPRESSION: Interval development of left pleural effusion and left lower lobe atelectasis versus airspace consolidation. Enlarged cardiac silhouette. Calcific atherosclerotic disease of the aorta. Electronically Signed   By: Ted Mcalpine M.D.   On: 10/19/2017 16:21    EKG: Independently reviewed. Atrial fibrillation with RVR (rate 141).   Assessment/Plan  1. Acute encephalopathy; recurrent falls  - Presents with 4-5 days of increased confusion, lethargy, anorexia, recurrent falls, and pain  - Per family, has been declining for a couple months  - Head CT negative for acute finding  - Likely toxic-metabolic, will treat the possible PNA, check thyroid studies, hydrate, continue supportive care   2. Atrial fibrillation with RVR  - Rate in 120-140 range in ED  - Treated  with IV Lopressor in ED, but not much improvement - Start diltiazem infusion, titrated for goal rate 60-110, convert back to oral once controlled  - CHADS-VASc is 34 (age x2, gender, HTN, CHF)  - Anticoagulated with coumadin, but with the recurrent falls suspect the risk outweighs benefit and will hold    3. CAP; pleural effusion - Presents with confusion, lethargy, anorexia, and falls  - Found to have leukocytosis, improved from prior, but also with elevated lactate and initial BP 90/56  - CT chest suggests possible pneumonia  - Blood cultures collected in ED, check sputum culture if she can produce, check strep pneumo antigen, procalcitonin - Start Rocephin and azithromycin    4. Hypernatremia  - Serum sodium 147 in setting of dehydration  - Fluid-resuscitated with NS in ED  - Repeat chem panel and change fluid composition as indicated    5. Hyperkalemia  - Serum potassium is 5.9 in setting of dehydration    - Fluid-resuscitated in ED, continued on IVF  - Continue cardiac monitoring, repeat chem panel   6. COPD - No wheezing or obstructive breathing on admission  - Continue prn nebs   7. Hyperthyroidism  - Tapazole on med list, but not recently filled per pharm  - Check thyroid studies given her presentation     DVT prophylaxis: SCD's  Code Status: DNR Family Communication: Daughter updated by phone  Disposition Plan: Admit to SDU  Consults called: None Admission status: Inpatient    Briscoe Deutscher, MD [facility] Pager [phone]  If 7PM-7AM, please contact night-coverage [email or web address] Password [facility]  10/17/2017, 8:48 PM

## 2017-11-01 NOTE — ED Notes (Signed)
Patient transported to X-ray 

## 2017-11-01 NOTE — ED Notes (Signed)
Portable xray at bedside.

## 2017-11-01 NOTE — ED Provider Notes (Addendum)
[facility] EMERGENCY DEPARTMENT Provider Note   CSN: [id number] Arrival date & time: 2017/11/14  1532     History   Chief Complaint Chief Complaint  Patient presents with  . Altered Mental Status    HPI Kelsey [name] is a 84 y.o. female.  HPI Patient is brought from home by home health assistant.  Patient was seen in the emergency department 8 days ago for fall.  At that time family had been in town and reported that she had several falls.  Patient is anticoagulated on Coumadin.  Note at that time indicates the patient was in no distress note is made of bruise to the left lateral chest wall, dorsum of left foot bilateral periorbital ecchymoses and small left frontal hematoma.  Diagnostic workup nonacute at that time. History is now from home health provider.  This provider reports she has not had extensive time with the patient.  Reportedly she was fine on Tuesday and got up to his usual activities and then back to bed.  Yesterday there was some concern for not eating well and decreased activity.  Today she reports she is been refusing to eat and has seemed to be in pain and confused. Past Medical History:  Diagnosis Date  . Asthma   . Atrial fibrillation (HCC)   . COPD (chronic obstructive pulmonary disease) (HCC)   . Depression   . GERD (gastroesophageal reflux disease)   . Hyperlipidemia   . Hypothyroidism   . Osteoporosis   . Restless leg syndrome   . Seasonal allergies     Patient Active Problem List   Diagnosis Date Noted  . Pleural effusion 11-14-2017  . Multiple falls 11-14-2017  . Hypernatremia 14-Nov-2017  . Hyperkalemia Nov 14, 2017  . Acute encephalopathy 11/14/2017  . Community acquired pneumonia 11-14-2017  . Hyperthyroidism Nov 14, 2017  . Atrial fibrillation with RVR (HCC) 10/26/2013  . Secondary pulmonary hypertension 10/26/2013  . COPD (chronic obstructive pulmonary disease) (HCC) 10/26/2013  . Essential hypertension 10/26/2013  .  Hyperlipidemia 10/26/2013  . Chronic diastolic heart failure (HCC) 10/26/2013    Past Surgical History:  Procedure Laterality Date  . BREAST CYST EXCISION  1970   breast cyst biopsy-benign  . BUNIONECTOMY  08/02   right bunionectomy  . CARDIOVERSION  12/16/2011   Procedure: CARDIOVERSION;  Surgeon: Donato Schultz, MD;  Location: [facility] OR;  Service: Cardiovascular;  Laterality: N/A;  . JOINT REPLACEMENT    . OTHER SURGICAL HISTORY  1995   left cataract removal with IOL  . OTHER SURGICAL HISTORY  2003   right cataract with IOL  . THYROIDECTOMY  1970   for benign tumor  . TOTAL HIP ARTHROPLASTY Left   . TOTAL KNEE ARTHROPLASTY Bilateral   . VAGINAL HYSTERECTOMY  1992   partial vaginal hysterectomy    OB History    No data available       Home Medications    Prior to Admission medications   Medication Sig Start Date End Date Taking? Authorizing Provider  acetaminophen (TYLENOL) 650 MG CR tablet Take 650 mg by mouth 2 (two) times daily with breakfast and lunch.    Yes [provider]  albuterol (PROVENTIL) (2.5 MG/3ML) 0.083% nebulizer solution Take 2.5 mg by nebulization every 4 (four) hours as needed for wheezing or shortness of breath.    Yes [provider]  Cholecalciferol (VITAMIN D-3 PO) Take 1 capsule by mouth daily with lunch.   Yes [provider]  diltiazem (TIAZAC) 240 MG 24 hr  capsule Take 240 mg by mouth daily.   Yes [provider]  ECHINACEA PO Take 1 capsule by mouth daily.   Yes [provider]  FLUoxetine (PROZAC) 20 MG capsule Take 20 mg by mouth daily.   Yes [provider]  furosemide (LASIX) 20 MG tablet Take 20 mg by mouth daily.   Yes [provider]  HYDROcodone-acetaminophen (NORCO/VICODIN) 5-325 MG tablet Take 1 tablet by mouth every 6 (six) hours as needed for moderate pain.   Yes [provider]  losartan (COZAAR) 25 MG tablet Take 25 mg by mouth daily.   Yes [provider]    methimazole (TAPAZOLE) 5 MG tablet Take 2.5 mg by mouth 3 (three) times a week.   Yes [provider]  metoprolol succinate (TOPROL-XL) 100 MG 24 hr tablet Take 100 mg by mouth 2 (two) times daily. Take with or immediately following a meal.   Yes [provider]  Multiple Vitamins-Minerals (PRESERVISION AREDS 2+MULTI VIT) CAPS Take 1 capsule by mouth daily.    Yes [provider]  NON FORMULARY Swiss Kriss laxarive tablets: Take 1 tablet by mouth once a day as needed for constipation   Yes [provider]  omeprazole (PRILOSEC) 20 MG capsule Take 20 mg by mouth daily.   Yes [provider]  Polyethyl Glyc-Propyl Glyc PF (SYSTANE PRESERVATIVE FREE) 0.4-0.3 % SOLN Place 1 drop into both eyes daily as needed (for dryness).    Yes [provider]  simvastatin (ZOCOR) 10 MG tablet Take 10 mg by mouth at bedtime.   Yes [provider]  warfarin (COUMADIN) 2 MG tablet Take 2-3 mg by mouth See admin instructions. 3 mg by mouth at 4 PM daily on Sun/Tues/Thurs/Sat and 2 mg on Mon/Wed/Fri   Yes [provider]  cephALEXin (KEFLEX) 500 MG capsule Take 1 capsule (500 mg total) by mouth 3 (three) times daily. Patient not taking: Reported on 11-12-2017 10/24/17   Raeford Razor, MD  traMADol-acetaminophen (ULTRACET) 37.5-325 MG tablet Take 1 tablet by mouth every 12 (twelve) hours as needed. Patient not taking: Reported on 12-Nov-2017 02/18/16   Mancel Bale, MD    Family History History reviewed. No pertinent family history.  Social History Social History   Tobacco Use  . Smoking status: Former Games developer  . Smokeless tobacco: Never Used  . Tobacco comment: QUIT  Substance Use Topics  . Alcohol use: No  . Drug use: Not on file     Allergies   Codeine; Morphine and related; Promethazine; and Tape   Review of Systems Review of Systems Level 5 caveat cannot obtain due to patient condition.  Physical Exam Updated Vital Signs BP  (!) 97/56   Pulse (!) 108   Temp 97.7 F (36.5 C) (Rectal)   Resp 18   SpO2 90%   Physical Exam  Constitutional:  Patient is moaning is closed.  She does not give any appropriate verbal response to questions.  She is resistant to rolling from the left side onto her back or repositioning.  Mild increased work of breathing.  Monitor shows atrial fibrillation rate of 120s-130s  HENT:  Patient has extensive facial contusion and hematoma pictured and images attached.  She does appear to have extensive resolving amount of hematoma with the right side of the face having a greenish purple hue.  He has a hematoma that appears slightly older to the left frontal area with small dry eschar Mucous membranes very dry.  Eyes:  Patient only  rarely opens her eyes but extraocular motions are coordinated.  Neck:  Soft tissues of the neck soft.  Cardiovascular:  Distant heart sounds, tachycardic irregularly irregular  Pulmonary/Chest:  Mild increased work of breathing with tachypnea.  Sounds are diminished on the left side.  fair breath sounds on the right without gross wheeze or rale.  Abdominal:  Abdomen is soft.  No guarding.  She is continually moaning difficult to assess if she is having pain with examination.  Musculoskeletal:  See attached images for extensive bruising to both feet and lower legs.  No obvious deformities.  Patient also has contusion to left hip.  She is spontaneously moving upper extremities.  No obvious deformities.  Neurological:  Patient is confused and moaning.  Neuro examination is nonfocal.  She does try to maintain herself lying on the left side and resists repositioning.  She is moving both extremities and lower extremities are somewhat resistant to movement.  No obvious focal flaccid paralysis.  Skin: Skin is warm and dry.  Psychiatric:  Very distressed.                     ED Treatments / Results  Labs (all labs ordered are listed, but only abnormal  results are displayed) Labs Reviewed  COMPREHENSIVE METABOLIC PANEL - Abnormal; Notable for the following components:      Result Value   Sodium 147 (*)    Potassium 5.9 (*)    BUN 42 (*)    Creatinine, Ser 1.05 (*)    Calcium 8.6 (*)    Total Protein 6.2 (*)    Albumin 3.2 (*)    AST 59 (*)    Total Bilirubin 1.9 (*)    GFR calc non Af Amer 44 (*)    GFR calc Af Amer 51 (*)    All other components within normal limits  PROTIME-INR - Abnormal; Notable for the following components:   Prothrombin Time 27.8 (*)    All other components within normal limits  CBC WITH DIFFERENTIAL/PLATELET - Abnormal; Notable for the following components:   WBC 18.7 (*)    RDW 18.7 (*)    Neutro Abs 16.1 (*)    Monocytes Absolute 1.3 (*)    All other components within normal limits  URINALYSIS, ROUTINE W REFLEX MICROSCOPIC - Abnormal; Notable for the following components:   Color, Urine AMBER (*)    Protein, ur 100 (*)    Squamous Epithelial / LPF 0-5 (*)    All other components within normal limits  I-STAT CG4 LACTIC ACID, ED - Abnormal; Notable for the following components:   Lactic Acid, Venous 3.10 (*)    All other components within normal limits  I-STAT CG4 LACTIC ACID, ED - Abnormal; Notable for the following components:   Lactic Acid, Venous 3.13 (*)    All other components within normal limits  CULTURE, BLOOD (ROUTINE X 2)  CULTURE, BLOOD (ROUTINE X 2)  URINE CULTURE  CK  CBG MONITORING, ED    EKG  EKG Interpretation  Date/Time:  Sunday 11/14/17 15:37:19 EST Ventricular Rate:  141 PR Interval:    QRS Duration: 76 QT Interval:  309 QTC Calculation: 474 R Axis:   84 Text Interpretation:  Atrial fibrillation with rapid V-rate Borderline right axis deviation Low voltage, extremity and precordial leads Minimal ST depression Nonspecific T abnormalities, inferior leads no change Confirmed by Arby Barrette [phone]) on 2017-11-14 5:33:07 PM       Radiology Ct Abdomen Pelvis  Wo Contrast  Result Date: 10/17/2017 CLINICAL DATA:  Altered mental status, falls, uncooperative patient. EXAM: CT CHEST, ABDOMEN AND PELVIS WITHOUT CONTRAST TECHNIQUE: Multidetector CT imaging of the chest, abdomen and pelvis was performed following the standard protocol without IV contrast. COMPARISON:  Chest radiograph 10/24/2017, 11/10/2017, head 120 19 FINDINGS: CT CHEST FINDINGS Cardiovascular: Coronary artery calcification and aortic atherosclerotic calcification. No pericardial effusion. Mediastinum/Nodes: No axillary or supraclavicular adenopathy. No mediastinal hilar adenopathy. Lungs/Pleura: Bilateral moderate pleural effusions. There is linear bands of atelectasis and potential loculated effusion within the lingula. No clear mass lesion. Ground-glass nodule in the RIGHT lower lobe measures 11 mm (image 23, series 3 Musculoskeletal: No evidence of rib fracture. Osteopenia. Severe degenerate changes shoulders. Chronic appearing compression fracture at T2 and T5 CT ABDOMEN AND PELVIS FINDINGS Hepatobiliary: 2 low-density lesions in the liver cannot be characterized without IV contrast. Central RIGHT hepatic lobe measures 12 mm and and LEFT lateral hepatic lobe measuring 9 mm both on image 50, series 3. Gallbladder normal. Pancreas: Pancreas is normal. No ductal dilatation. No pancreatic inflammation. Spleen: Normal spleen Adrenals/urinary tract: Adrenal glands and kidneys are normal. The ureters and bladder normal. Stomach/Bowel: The entire stomach is contained within a large hiatal hernia which extends into the posterior mediastinum behind the heart and into the LEFT hemithorax. The stomach is inverted such that the cardia is below the antrum (organoaxial volvulus). No evidence of high-grade obstruction. Duodenum small bowel are normal. Colon rectosigmoid colon normal. Vascular/Lymphatic: Abdominal aorta is normal caliber. There is no retroperitoneal or periportal lymphadenopathy. No pelvic  lymphadenopathy. Reproductive: Post hysterectomy Other: No free fluid. Musculoskeletal: LEFT hip prosthetic. Severe osteopenia of the bones. Bulky osteophytosis IMPRESSION: Chest Impression: 1. Bilateral pleural effusions. 2. Atelectasis and loculated effusion in the LEFT lung in part related to the large hiatal hernia. No acute findings or malignancy identified. 3. Ground-glass nodule in the RIGHT lower lobe may represent small focus of infection. Low-grade neoplasm cannot be excluded. 4. Chronic compression fracture the upper thoracic spine. Abdomen / Pelvis Impression: 1. Large hiatal hernia with the entire stomach above the diaphragm within a large hernia sac . 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 2. No acute findings in the abdomen pelvis. 3. Two hypodense lesions in liver cannot be characterized. 4. Severe degenerative arthropathy of the lumbar spine. Electronically Signed   By: Genevive Bi M.D.   On: 11/02/2017 18:20   Dg Tibia/fibula Left  Result Date: 11/07/2017 CLINICAL DATA:  84 year old female status post multiple falls EXAM: LEFT TIBIA AND FIBULA - 2 VIEW COMPARISON:  Concurrently obtained radiographs of the right tib-fib FINDINGS: Surgical changes of prior total knee arthroplasty without evidence of hardware complication or periprosthetic fracture. No acute fracture or malalignment identified. The bones appear osteopenic. IMPRESSION: Negative. Electronically Signed   By: Malachy Moan M.D.   On: 10/29/2017 18:30   Dg Tibia/fibula Right  Result Date: 10/25/2017 CLINICAL DATA:  84 year old female status post fall EXAM: RIGHT TIBIA AND FIBULA - 2 VIEW COMPARISON:  Concurrently obtained radiographs of the left tib-fib FINDINGS: Surgical changes of prior total knee arthroplasty without evidence of hardware complication. No acute fracture or malalignment. The bones appear osteopenic. Soft tissues are  unremarkable. IMPRESSION: Negative. Electronically Signed   By: Malachy Moan M.D.   On: 10/28/2017 18:30   Ct Head Wo Contrast  Result Date: 10/22/2017 CLINICAL DATA:  Altered mental status.  Multiple falls. EXAM: CT HEAD WITHOUT CONTRAST  CT CERVICAL SPINE WITHOUT CONTRAST TECHNIQUE: Multidetector CT imaging of the head and cervical spine was performed following the standard protocol without intravenous contrast. Multiplanar CT image reconstructions of the cervical spine were also generated. COMPARISON:  None. FINDINGS: CT HEAD FINDINGS Brain: No evidence of acute infarction, hemorrhage, hydrocephalus, extra-axial collection or mass lesion/mass effect. Marked brain parenchymal atrophy and white matter microangiopathy. Lacunar infarct in bilateral basal ganglia. Vascular: Calcific atherosclerotic disease at the skull base. Skull: Normal. Negative for fracture or focal lesion. Sinuses/Orbits: No acute finding. Other: Left frontal scalp hematoma. CT CERVICAL SPINE FINDINGS Alignment: 3 mm posterior listhesis of T1 vertebral body. Skull base and vertebrae: Compression deformity of T1 vertebral body, which is poorly visualized due to motion artifact, however suspicious for acute fracture. Soft tissues and spinal canal: No prevertebral fluid or swelling. No visible canal hematoma. Disc levels: Advanced multilevel osteoarthritic changes of the cervical spine. Upper chest: Large left pleural effusion. Other: None. IMPRESSION: No acute intracranial abnormality. Advanced brain parenchymal atrophy, chronic microvascular disease, chronic bilateral basal ganglia lacunar infarcts. Suspected acute fracture of T1 vertebral body, poorly visualized due to motion artifact, with approximate 30% height loss. 3 mm posterior listhesis of T1 on C7. Multilevel osteoarthritic changes of the cervical spine. Large left pleural effusion. These results were called by telephone at the time of interpretation on 10/26/2017 at 6:22 pm to Dr.  Arby Barrette , who verbally acknowledged these results. Electronically Signed   By: Ted Mcalpine M.D.   On: 11/12/2017 18:23   Ct Chest Wo Contrast  Result Date: 10/27/2017 CLINICAL DATA:  Altered mental status, falls, uncooperative patient. EXAM: CT CHEST, ABDOMEN AND PELVIS WITHOUT CONTRAST TECHNIQUE: Multidetector CT imaging of the chest, abdomen and pelvis was performed following the standard protocol without IV contrast. COMPARISON:  Chest radiograph 10/24/2017, 10/25/2017, head 120 19 FINDINGS: CT CHEST FINDINGS Cardiovascular: Coronary artery calcification and aortic atherosclerotic calcification. No pericardial effusion. Mediastinum/Nodes: No axillary or supraclavicular adenopathy. No mediastinal hilar adenopathy. Lungs/Pleura: Bilateral moderate pleural effusions. There is linear bands of atelectasis and potential loculated effusion within the lingula. No clear mass lesion. Ground-glass nodule in the RIGHT lower lobe measures 11 mm (image 23, series 3 Musculoskeletal: No evidence of rib fracture. Osteopenia. Severe degenerate changes shoulders. Chronic appearing compression fracture at T2 and T5 CT ABDOMEN AND PELVIS FINDINGS Hepatobiliary: 2 low-density lesions in the liver cannot be characterized without IV contrast. Central RIGHT hepatic lobe measures 12 mm and and LEFT lateral hepatic lobe measuring 9 mm both on image 50, series 3. Gallbladder normal. Pancreas: Pancreas is normal. No ductal dilatation. No pancreatic inflammation. Spleen: Normal spleen Adrenals/urinary tract: Adrenal glands and kidneys are normal. The ureters and bladder normal. Stomach/Bowel: The entire stomach is contained within a large hiatal hernia which extends into the posterior mediastinum behind the heart and into the LEFT hemithorax. The stomach is inverted such that the cardia is below the antrum (organoaxial volvulus). No evidence of high-grade obstruction. Duodenum small bowel are normal. Colon rectosigmoid  colon normal. Vascular/Lymphatic: Abdominal aorta is normal caliber. There is no retroperitoneal or periportal lymphadenopathy. No pelvic lymphadenopathy. Reproductive: Post hysterectomy Other: No free fluid. Musculoskeletal: LEFT hip prosthetic. Severe osteopenia of the bones. Bulky osteophytosis IMPRESSION: Chest Impression: 1. Bilateral pleural effusions. 2. Atelectasis and loculated effusion in the LEFT lung in part related to the large hiatal hernia. No acute findings or malignancy identified. 3. Ground-glass nodule in the RIGHT lower lobe may represent small focus of infection. Low-grade neoplasm cannot be excluded. 4.  Chronic compression fracture the upper thoracic spine. Abdomen / Pelvis Impression: 1. Large hiatal hernia with the entire stomach above the diaphragm within a large hernia sac . 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 2. No acute findings in the abdomen pelvis. 3. Two hypodense lesions in liver cannot be characterized. 4. Severe degenerative arthropathy of the lumbar spine. Electronically Signed   By: Genevive Bi M.D.   On: 10/14/2017 18:20   Ct Cervical Spine Wo Contrast  Result Date: 10/18/2017 CLINICAL DATA:  Altered mental status.  Multiple falls. EXAM: CT HEAD WITHOUT CONTRAST CT CERVICAL SPINE WITHOUT CONTRAST TECHNIQUE: Multidetector CT imaging of the head and cervical spine was performed following the standard protocol without intravenous contrast. Multiplanar CT image reconstructions of the cervical spine were also generated. COMPARISON:  None. FINDINGS: CT HEAD FINDINGS Brain: No evidence of acute infarction, hemorrhage, hydrocephalus, extra-axial collection or mass lesion/mass effect. Marked brain parenchymal atrophy and white matter microangiopathy. Lacunar infarct in bilateral basal ganglia. Vascular: Calcific atherosclerotic disease at the skull base. Skull: Normal. Negative for fracture or  focal lesion. Sinuses/Orbits: No acute finding. Other: Left frontal scalp hematoma. CT CERVICAL SPINE FINDINGS Alignment: 3 mm posterior listhesis of T1 vertebral body. Skull base and vertebrae: Compression deformity of T1 vertebral body, which is poorly visualized due to motion artifact, however suspicious for acute fracture. Soft tissues and spinal canal: No prevertebral fluid or swelling. No visible canal hematoma. Disc levels: Advanced multilevel osteoarthritic changes of the cervical spine. Upper chest: Large left pleural effusion. Other: None. IMPRESSION: No acute intracranial abnormality. Advanced brain parenchymal atrophy, chronic microvascular disease, chronic bilateral basal ganglia lacunar infarcts. Suspected acute fracture of T1 vertebral body, poorly visualized due to motion artifact, with approximate 30% height loss. 3 mm posterior listhesis of T1 on C7. Multilevel osteoarthritic changes of the cervical spine. Large left pleural effusion. These results were called by telephone at the time of interpretation on 11/02/2017 at 6:22 pm to Dr. Arby Barrette , who verbally acknowledged these results. Electronically Signed   By: Ted Mcalpine M.D.   On: 10/20/2017 18:23   Dg Chest Portable 1 View  Result Date: 10/17/2017 CLINICAL DATA:  Increased altered mental status. EXAM: PORTABLE CHEST 1 VIEW COMPARISON:  10/24/2017 FINDINGS: Enlarged cardiac silhouette. Calcific atherosclerotic disease of the aorta. Mediastinal contours appear intact. There is no evidence of focal pneumothorax. Interval development of left pleural effusion and left lower lobe airspace consolidation versus atelectasis. Osseous structures are without acute abnormality. Soft tissues are grossly normal. IMPRESSION: Interval development of left pleural effusion and left lower lobe atelectasis versus airspace consolidation. Enlarged cardiac silhouette. Calcific atherosclerotic disease of the aorta. Electronically Signed   By: Ted Mcalpine M.D.   On: 11/04/2017 16:21    Procedures Procedures (including critical care time)  Medications Ordered in ED Medications  sodium chloride 0.9 % bolus 1,000 mL (0 mLs Intravenous Stopped 10/14/2017 1826)  sodium chloride 0.9 % bolus 500 mL (0 mLs Intravenous Stopped 10/14/2017 1757)  0.9 %  sodium chloride infusion ( Intravenous Rate/Dose Change 10/23/2017 1940)  morphine 4 MG/ML injection 2 mg (2 mg Intravenous Given 10/31/2017 1632)  morphine 4 MG/ML injection 2 mg (2 mg Intravenous Given 10/18/2017 1703)  metoprolol tartrate (LOPRESSOR) injection 2.5 mg (2.5 mg Intravenous Given 10/24/2017 1941)     Initial Impression / Assessment and Plan / ED Course  I have reviewed the triage vital signs and the nursing notes.  Pertinent labs & imaging results that were available during my care of the patient were reviewed by me and considered in my medical decision making (see chart for details).    consult: Dr. Antionette Charpyd for admission.  Final Clinical Impressions(s) / ED Diagnoses   Final diagnoses:  Delirium  AKI (acute kidney injury) (HCC)  Dementia without behavioral disturbance, unspecified dementia type  Failure to thrive (0-17)  Atrial fibrillation with RVR (HCC)  Anticoagulated on Coumadin  Hematoma  DNR (do not resuscitate)  His daughter advises that the patient has taken a significant decline over about the past 1-2 months.  She has had decreasing ability to eat and has been needing to be encouraged to take an Ensure.  She is still maintained mobility and makes transfers to bedside commode.  Her caregiver reports that she did make such a transfer yesterday.  Today, patient is very confused and not eating or drinking anything.  Mental status is poor in the emergency department.  Repeat scanning has been done to identify possible internal injury.  Identified is possible fracture of T1 but with motion artifact degradation.  On CT scan done last week anterolisthesis was identified at this area.   The patient is dehydrated and confused.  Atrial fibrillation is not rate controlled.  This time, plan will be for admission to try to temporize and manage medical conditions.  I have reviewed the patient's CODE STATUS with the patient's daughter.  She does agree at this time with the patient's significant comorbid illness and recent progressive decline, DNR with comfort care is most appropriate.  ED Discharge Orders    None       Arby BarrettePfeiffer, Treston Coker, MD 10/15/2017 Willette [id number]    Arby BarrettePfeiffer, Cameron Schwinn, MD 10/31/2017 2000

## 2017-11-02 DIAGNOSIS — Z5181 Encounter for therapeutic drug level monitoring: Secondary | ICD-10-CM

## 2017-11-02 DIAGNOSIS — G9340 Encephalopathy, unspecified: Secondary | ICD-10-CM

## 2017-11-02 DIAGNOSIS — Z7901 Long term (current) use of anticoagulants: Secondary | ICD-10-CM

## 2017-11-02 DIAGNOSIS — I5032 Chronic diastolic (congestive) heart failure: Secondary | ICD-10-CM

## 2017-11-02 DIAGNOSIS — I4891 Unspecified atrial fibrillation: Secondary | ICD-10-CM

## 2017-11-02 DIAGNOSIS — N179 Acute kidney failure, unspecified: Secondary | ICD-10-CM

## 2017-11-02 LAB — CBC
HCT: 38.2 % (ref 36.0–46.0)
Hemoglobin: 11.4 g/dL — ABNORMAL LOW (ref 12.0–15.0)
MCH: 30.3 pg (ref 26.0–34.0)
MCHC: 29.8 g/dL — AB (ref 30.0–36.0)
MCV: 101.6 fL — ABNORMAL HIGH (ref 78.0–100.0)
PLATELETS: 150 10*3/uL (ref 150–400)
RBC: 3.76 MIL/uL — ABNORMAL LOW (ref 3.87–5.11)
RDW: 18.9 % — ABNORMAL HIGH (ref 11.5–15.5)
WBC: 15.8 10*3/uL — ABNORMAL HIGH (ref 4.0–10.5)

## 2017-11-02 LAB — COMPREHENSIVE METABOLIC PANEL
ALBUMIN: 2.5 g/dL — AB (ref 3.5–5.0)
ALK PHOS: 94 U/L (ref 38–126)
ALT: 22 U/L (ref 14–54)
AST: 29 U/L (ref 15–41)
Anion gap: 13 (ref 5–15)
BILIRUBIN TOTAL: 1 mg/dL (ref 0.3–1.2)
BUN: 39 mg/dL — ABNORMAL HIGH (ref 6–20)
CALCIUM: 7.3 mg/dL — AB (ref 8.9–10.3)
CO2: 19 mmol/L — AB (ref 22–32)
CREATININE: 1.07 mg/dL — AB (ref 0.44–1.00)
Chloride: 117 mmol/L — ABNORMAL HIGH (ref 101–111)
GFR calc non Af Amer: 43 mL/min — ABNORMAL LOW (ref >60)
GFR, EST AFRICAN AMERICAN: 50 mL/min — AB (ref >60)
GLUCOSE: 85 mg/dL (ref 65–99)
Potassium: 3.8 mmol/L (ref 3.5–5.1)
SODIUM: 149 mmol/L — AB (ref 135–145)
TOTAL PROTEIN: 5 g/dL — AB (ref 6.5–8.1)

## 2017-11-02 LAB — PROCALCITONIN: PROCALCITONIN: 0.16 ng/mL

## 2017-11-02 LAB — LACTIC ACID, PLASMA: LACTIC ACID, VENOUS: 2.3 mmol/L — AB (ref 0.5–1.9)

## 2017-11-02 LAB — URINE CULTURE: CULTURE: NO GROWTH

## 2017-11-02 MED ORDER — DEXTROSE-NACL 5-0.45 % IV SOLN
INTRAVENOUS | Status: DC
  Administered 2017-11-02 – 2017-11-04 (×2): via INTRAVENOUS

## 2017-11-02 NOTE — Progress Notes (Signed)
[facility]                                                                              Patient Demographics  Kelsey [name], is a 84 y.o. female, DOB - 1922/02/22, [place] date - 11/08/2017   Admitting Physician Vianne Bulls, MD  Outpatient Primary MD for the patient is Kelton Pillar, MD  Outpatient specialists:   LOS - 1  days   Medical records reviewed and are as summarized below:    Chief Complaint  Patient presents with  . Altered Mental Status       Brief summary  Kelsey [name] is a 84 y.o. female with medical history significant for atrial fibrillation on Coumadin, COPD, thyroid disease, hypertension, and depression, presented to ED with increased confusion, lethargy, food refusal, and multiple falls. Patient lives at home where she is cared for by a home health aide.  Patient had multiple falls over the past couple weeks and has been increasingly confused, refusing food or drink, and less active for the past 4-5 days.  Per caregiver, she has been groaning in apparent pain.  Patient was recently treated with Ceftin for a UTI and had completed the course.   In ED, patient was tachycardia, A. fib with RVR, BP low 90/56.  EKG showed A. fib with RVR with heart rate 141.  Chest x-ray showed left-sided pleural effusion with left lower lobe pneumonia or atelectasis.  Patient was admitted for further workup.      Assessment & Plan    Principal Problem:   Acute metabolic encephalopathy with recurrent falls -Per family, has been declining for the last couple of months  - CT head negative for acute intracranial abnormality -UA negative for UTI, continue IV Zithromax, Rocephin -Given age, possible dementia, failure to thrive, overall declining in the last few months, will consult palliative medicine for goals of care   Active Problems:   Atrial fibrillation with RVR (HCC) rate 120-140 range in ED, -Initially was treated with IV Lopressor  in ED, no improvement -Started on Cardizem infusion, will continue -Currently anticoagulated with Coumadin, however having recent multiple falls, not a good candidate for anti-correlation anymore, risk outweighs benefit at this time    COPD (chronic obstructive pulmonary disease) (HCC) -Continue duo nebs as needed -  Pulmonary hygiene, O2 supplementation  Community-acquired pneumonia, left-sided pleural effusion -Patient met Sirs/sepsis criteria given leukocytosis, hypotension, tachycardia, pneumonia - Follow blood cultures, follow sputum cultures, urine strep antigen, urine-Legionella - Continue IV Zithromax, Rocephin   Hypernatremia, hyperkalemia, lactic acidosis - In the setting of dehydration-continue gentle IV fluid hydration, change to D5 half-normal. - Sodium 147, potassium 5.9 at the time of admission - Potassium improved to 3.8    Essential hypertension Continue Cardizem infusion, monitor BP closely-     Hyperthyroidism  - has not been taking Tapazole, check TSH, free T4   Code Status: dnr  DVT Prophylaxis:   SCD's Family Communication: Discussed in detail with the patient, all imaging results, lab results explained to the patient' caregivers    Disposition Plan:  Time Spent in minutes  35 minutes  Procedures:  None   Consultants:   None Antimicrobials:    IV Zithromax 1/20  IV Rocephin 1/20   Medications  Scheduled Meds: . FLUoxetine  20 mg Oral Daily  . multivitamin  1 tablet Oral Daily  . simvastatin  10 mg Oral q1800   Continuous Infusions: . azithromycin Stopped (11/02/17 0016)  . cefTRIAXone (ROCEPHIN)  IV Stopped (11/12/2017 2205)  . diltiazem (CARDIZEM) infusion 14 mg/hr (11/02/17 0842)   PRN Meds:.acetaminophen, albuterol, LORazepam, morphine injection, ondansetron (ZOFRAN) IV, polyvinyl alcohol   Antibiotics   Anti-infectives (From admission, onward)   Start     Dose/Rate Route Frequency Ordered Stop   11/06/2017 2015  cefTRIAXone  (ROCEPHIN) 1 g in dextrose 5 % 50 mL IVPB     1 g 100 mL/hr over 30 Minutes Intravenous Every 24 hours 10/30/2017 2013 11/08/17 2014   10/20/2017 2015  azithromycin (ZITHROMAX) 500 mg in dextrose 5 % 250 mL IVPB     500 mg 250 mL/hr over 60 Minutes Intravenous Every 24 hours 10/25/2017 2013 11/08/17 2014        Subjective:   Kelsey [name] was seen and examined today.  Difficult to obtain review of system from the patient due to her mental status. very frail, appears to be comfortable at this time.  Caregiver at the bedside.    Objective:   Vitals:   11/02/17 1000 11/02/17 1030 11/02/17 1100 11/02/17 1130  BP: 97/61 107/68 108/61 111/88  Pulse:      Resp: (!) 28 (!) 25 (!) 28 (!) 24  Temp:      TempSrc:      SpO2:        Intake/Output Summary (Last 24 hours) at 11/02/2017 1147 Last data filed at 11/02/2017 0016 Gross per 24 hour  Intake 2583.5 ml  Output -  Net 2583.5 ml     Wt Readings from Last 3 Encounters:  10/26/13 69.4 kg (153 lb)  12/16/11 65.8 kg (145 lb)     Exam  General: Somnolent, NAD  Eyes:   HEENT:  Atraumatic, normocephalic, aspen collar +  Cardiovascular: Irregularly irregular, tachycardia  Respiratory: Decreased breath sound at the bases   gastrointestinal: Soft, nontender, nondistended, + bowel sounds  Ext: no pedal edema bilaterally  Neuro: unable to assess  Musculoskeletal: No digital cyanosis, clubbing  Skin: No rashes  Psych: somnolent   Data Reviewed:  I have personally reviewed following labs and imaging studies  Micro Results Recent Results (from the past 240 hour(s))  Urine culture     Status: Abnormal   Collection Time: 10/24/17  2:08 PM  Result Value Ref Range Status   Specimen Description URINE, CLEAN CATCH  Final   Special Requests NONE  Final   Culture >=100,000 COLONIES/mL ESCHERICHIA COLI (A)  Final   Report Status 10/26/2017 FINAL  Final   Organism ID, Bacteria ESCHERICHIA COLI (A)  Final      Susceptibility    Escherichia coli - MIC*    AMPICILLIN 4 SENSITIVE Sensitive     CEFAZOLIN <=4 SENSITIVE Sensitive     CEFTRIAXONE <=1 SENSITIVE Sensitive     CIPROFLOXACIN <=0.25 SENSITIVE Sensitive     GENTAMICIN <=1 SENSITIVE Sensitive     IMIPENEM <=0.25 SENSITIVE Sensitive     NITROFURANTOIN 64 INTERMEDIATE Intermediate     TRIMETH/SULFA <=20 SENSITIVE Sensitive     AMPICILLIN/SULBACTAM <=2 SENSITIVE Sensitive     PIP/TAZO <=4 SENSITIVE Sensitive     Extended ESBL NEGATIVE Sensitive     * >=100,000 COLONIES/mL  ESCHERICHIA COLI    Radiology Reports Ct Abdomen Pelvis Wo Contrast  Result Date: 10/26/2017 CLINICAL DATA:  Altered mental status, falls, uncooperative patient. EXAM: CT CHEST, ABDOMEN AND PELVIS WITHOUT CONTRAST TECHNIQUE: Multidetector CT imaging of the chest, abdomen and pelvis was performed following the standard protocol without IV contrast. COMPARISON:  Chest radiograph 10/24/2017, 11/10/2017, head 120 19 FINDINGS: CT CHEST FINDINGS Cardiovascular: Coronary artery calcification and aortic atherosclerotic calcification. No pericardial effusion. Mediastinum/Nodes: No axillary or supraclavicular adenopathy. No mediastinal hilar adenopathy. Lungs/Pleura: Bilateral moderate pleural effusions. There is linear bands of atelectasis and potential loculated effusion within the lingula. No clear mass lesion. Ground-glass nodule in the RIGHT lower lobe measures 11 mm (image 23, series 3 Musculoskeletal: No evidence of rib fracture. Osteopenia. Severe degenerate changes shoulders. Chronic appearing compression fracture at T2 and T5 CT ABDOMEN AND PELVIS FINDINGS Hepatobiliary: 2 low-density lesions in the liver cannot be characterized without IV contrast. Central RIGHT hepatic lobe measures 12 mm and and LEFT lateral hepatic lobe measuring 9 mm both on image 50, series 3. Gallbladder normal. Pancreas: Pancreas is normal. No ductal dilatation. No pancreatic inflammation. Spleen: Normal spleen Adrenals/urinary  tract: Adrenal glands and kidneys are normal. The ureters and bladder normal. Stomach/Bowel: The entire stomach is contained within a large hiatal hernia which extends into the posterior mediastinum behind the heart and into the LEFT hemithorax. The stomach is inverted such that the cardia is below the antrum (organoaxial volvulus). No evidence of high-grade obstruction. Duodenum small bowel are normal. Colon rectosigmoid colon normal. Vascular/Lymphatic: Abdominal aorta is normal caliber. There is no retroperitoneal or periportal lymphadenopathy. No pelvic lymphadenopathy. Reproductive: Post hysterectomy Other: No free fluid. Musculoskeletal: LEFT hip prosthetic. Severe osteopenia of the bones. Bulky osteophytosis IMPRESSION: Chest Impression: 1. Bilateral pleural effusions. 2. Atelectasis and loculated effusion in the LEFT lung in part related to the large hiatal hernia. No acute findings or malignancy identified. 3. Ground-glass nodule in the RIGHT lower lobe may represent small focus of infection. Low-grade neoplasm cannot be excluded. 4. Chronic compression fracture the upper thoracic spine. Abdomen / Pelvis Impression: 1. Large hiatal hernia with the entire stomach above the diaphragm within a large hernia sac . 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 2. No acute findings in the abdomen pelvis. 3. Two hypodense lesions in liver cannot be characterized. 4. Severe degenerative arthropathy of the lumbar spine. Electronically Signed   By: Suzy Bouchard M.D.   On: 10/15/2017 18:20   Dg Ribs Unilateral W/chest Left  Result Date: 10/24/2017 CLINICAL DATA:  Fall, left lateral chest pain EXAM: LEFT RIBS AND CHEST - 3+ VIEW COMPARISON:  11/25/2006 FINDINGS: Cardiomegaly. Left lower lobe atelectasis. No visible left rib fracture. No effusion or pneumothorax. IMPRESSION: Cardiomegaly.  Left lower lobe atelectasis. No visible rib fracture.  Electronically Signed   By: Rolm Baptise M.D.   On: 10/24/2017 10:49   Dg Tibia/fibula Left  Result Date: 11/04/2017 CLINICAL DATA:  84 year old female status post multiple falls EXAM: LEFT TIBIA AND FIBULA - 2 VIEW COMPARISON:  Concurrently obtained radiographs of the right tib-fib FINDINGS: Surgical changes of prior total knee arthroplasty without evidence of hardware complication or periprosthetic fracture. No acute fracture or malalignment identified. The bones appear osteopenic. IMPRESSION: Negative. Electronically Signed   By: Jacqulynn Cadet M.D.   On: 10/14/2017 18:30   Dg Tibia/fibula Right  Result Date: 10/25/2017 CLINICAL DATA:  84 year old female status post fall EXAM: RIGHT TIBIA  AND FIBULA - 2 VIEW COMPARISON:  Concurrently obtained radiographs of the left tib-fib FINDINGS: Surgical changes of prior total knee arthroplasty without evidence of hardware complication. No acute fracture or malalignment. The bones appear osteopenic. Soft tissues are unremarkable. IMPRESSION: Negative. Electronically Signed   By: Jacqulynn Cadet M.D.   On: 10/29/2017 18:30   Ct Head Wo Contrast  Result Date: 10/30/2017 CLINICAL DATA:  Altered mental status.  Multiple falls. EXAM: CT HEAD WITHOUT CONTRAST CT CERVICAL SPINE WITHOUT CONTRAST TECHNIQUE: Multidetector CT imaging of the head and cervical spine was performed following the standard protocol without intravenous contrast. Multiplanar CT image reconstructions of the cervical spine were also generated. COMPARISON:  None. FINDINGS: CT HEAD FINDINGS Brain: No evidence of acute infarction, hemorrhage, hydrocephalus, extra-axial collection or mass lesion/mass effect. Marked brain parenchymal atrophy and white matter microangiopathy. Lacunar infarct in bilateral basal ganglia. Vascular: Calcific atherosclerotic disease at the skull base. Skull: Normal. Negative for fracture or focal lesion. Sinuses/Orbits: No acute finding. Other: Left frontal scalp hematoma.  CT CERVICAL SPINE FINDINGS Alignment: 3 mm posterior listhesis of T1 vertebral body. Skull base and vertebrae: Compression deformity of T1 vertebral body, which is poorly visualized due to motion artifact, however suspicious for acute fracture. Soft tissues and spinal canal: No prevertebral fluid or swelling. No visible canal hematoma. Disc levels: Advanced multilevel osteoarthritic changes of the cervical spine. Upper chest: Large left pleural effusion. Other: None. IMPRESSION: No acute intracranial abnormality. Advanced brain parenchymal atrophy, chronic microvascular disease, chronic bilateral basal ganglia lacunar infarcts. Suspected acute fracture of T1 vertebral body, poorly visualized due to motion artifact, with approximate 30% height loss. 3 mm posterior listhesis of T1 on C7. Multilevel osteoarthritic changes of the cervical spine. Large left pleural effusion. These results were called by telephone at the time of interpretation on 10/26/2017 at 6:22 pm to Dr. Charlesetta Shanks , who verbally acknowledged these results. Electronically Signed   By: Fidela Salisbury M.D.   On: 10/27/2017 18:23   Ct Head Wo Contrast  Result Date: 10/24/2017 CLINICAL DATA:  Bilateral periorbital bruising following a fall last night and 3 nights ago. On Coumadin. EXAM: CT HEAD WITHOUT CONTRAST CT MAXILLOFACIAL WITHOUT CONTRAST CT CERVICAL SPINE WITHOUT CONTRAST TECHNIQUE: Multidetector CT imaging of the head, cervical spine, and maxillofacial structures were performed using the standard protocol without intravenous contrast. Multiplanar CT image reconstructions of the cervical spine and maxillofacial structures were also generated. COMPARISON:  None. FINDINGS: CT HEAD FINDINGS Brain: Diffusely enlarged ventricles and subarachnoid spaces. Patchy white matter low density in both cerebral hemispheres. Old bilateral basal ganglia lacunar infarcts. No intracranial hemorrhage, mass lesion or CT evidence of acute infarction.  Vascular: No hyperdense vessel or unexpected calcification. Skull: Mildly limited due to motion degradation. Normal. Negative for fracture or focal lesion. Other: Midline and left frontal scalp hematoma. CT MAXILLOFACIAL FINDINGS Osseous: Nondisplaced fracture of the distal tip of the nasal bone. Mild motion degradation with no additional fractures visualized. The anterior maxillary spine is intact. Orbits: Status post bilateral cataract extraction. Otherwise, normal appearing orbital contents. Sinuses: Normally pneumatized. Soft tissues: Bilateral periorbital soft tissue hematomas. CT CERVICAL SPINE FINDINGS Alignment: Grade 1 anterolisthesis at the C7-T1 level. Skull base and vertebrae: Mildly limited due to motion degradation. No acute fracture. No primary bone lesion or focal pathologic process. Soft tissues and spinal canal: No prevertebral fluid or swelling. No visible canal hematoma. Disc levels: Multilevel degenerative changes, including facet degenerative changes at multiple levels. Upper chest: Mild biapical pleural and parenchymal scarring.  Other: Enlarged right lobe of the thyroid gland containing multiple nodules measuring up to 2.1 cm in maximum diameter each. Surgically absent left lobe of the thyroid gland. IMPRESSION: 1. The examinations are mildly limited by patient motion. 2. No skull fracture or intracranial hemorrhage. 3. Nondisplaced fracture of the tip of the nasal bone. 4. No cervical spine fracture or traumatic subluxation. 5. Bilateral periorbital soft tissue hematomas and midline and left frontal scalp hematomas. 6. Mild to moderate diffuse cerebral and cerebellar atrophy. 7. Extensive chronic small vessel white matter ischemic changes in both cerebral hemispheres. 8. Old bilateral basal ganglia lacunar infarcts. 9. Multilevel cervical spine degenerative changes with associated grade 1 anterolisthesis at the C7-T1 level. 10. Multinodular thyroid goiter on the right with the largest nodule  measuring 2.1 cm. Recommend further evaluation with thyroid ultrasound. If patient is clinically hyperthyroid, consider nuclear medicine thyroid uptake and scan. Electronically Signed   By: Claudie Revering M.D.   On: 10/24/2017 11:38   Ct Chest Wo Contrast  Result Date: 10/29/2017 CLINICAL DATA:  Altered mental status, falls, uncooperative patient. EXAM: CT CHEST, ABDOMEN AND PELVIS WITHOUT CONTRAST TECHNIQUE: Multidetector CT imaging of the chest, abdomen and pelvis was performed following the standard protocol without IV contrast. COMPARISON:  Chest radiograph 10/24/2017, 10/29/2017, head 120 19 FINDINGS: CT CHEST FINDINGS Cardiovascular: Coronary artery calcification and aortic atherosclerotic calcification. No pericardial effusion. Mediastinum/Nodes: No axillary or supraclavicular adenopathy. No mediastinal hilar adenopathy. Lungs/Pleura: Bilateral moderate pleural effusions. There is linear bands of atelectasis and potential loculated effusion within the lingula. No clear mass lesion. Ground-glass nodule in the RIGHT lower lobe measures 11 mm (image 23, series 3 Musculoskeletal: No evidence of rib fracture. Osteopenia. Severe degenerate changes shoulders. Chronic appearing compression fracture at T2 and T5 CT ABDOMEN AND PELVIS FINDINGS Hepatobiliary: 2 low-density lesions in the liver cannot be characterized without IV contrast. Central RIGHT hepatic lobe measures 12 mm and and LEFT lateral hepatic lobe measuring 9 mm both on image 50, series 3. Gallbladder normal. Pancreas: Pancreas is normal. No ductal dilatation. No pancreatic inflammation. Spleen: Normal spleen Adrenals/urinary tract: Adrenal glands and kidneys are normal. The ureters and bladder normal. Stomach/Bowel: The entire stomach is contained within a large hiatal hernia which extends into the posterior mediastinum behind the heart and into the LEFT hemithorax. The stomach is inverted such that the cardia is below the antrum (organoaxial  volvulus). No evidence of high-grade obstruction. Duodenum small bowel are normal. Colon rectosigmoid colon normal. Vascular/Lymphatic: Abdominal aorta is normal caliber. There is no retroperitoneal or periportal lymphadenopathy. No pelvic lymphadenopathy. Reproductive: Post hysterectomy Other: No free fluid. Musculoskeletal: LEFT hip prosthetic. Severe osteopenia of the bones. Bulky osteophytosis IMPRESSION: Chest Impression: 1. Bilateral pleural effusions. 2. Atelectasis and loculated effusion in the LEFT lung in part related to the large hiatal hernia. No acute findings or malignancy identified. 3. Ground-glass nodule in the RIGHT lower lobe may represent small focus of infection. Low-grade neoplasm cannot be excluded. 4. Chronic compression fracture the upper thoracic spine. Abdomen / Pelvis Impression: 1. Large hiatal hernia with the entire stomach above the diaphragm within a large hernia sac . 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 2. No acute findings in the abdomen pelvis. 3. Two hypodense lesions in liver cannot be characterized. 4. Severe degenerative arthropathy of the lumbar spine. Electronically Signed   By: Suzy Bouchard M.D.   On: 10/26/2017 18:20   Ct Cervical Spine Wo Contrast  Result Date: 11/08/2017 CLINICAL DATA:  Altered mental status.  Multiple falls. EXAM: CT HEAD WITHOUT CONTRAST CT CERVICAL SPINE WITHOUT CONTRAST TECHNIQUE: Multidetector CT imaging of the head and cervical spine was performed following the standard protocol without intravenous contrast. Multiplanar CT image reconstructions of the cervical spine were also generated. COMPARISON:  None. FINDINGS: CT HEAD FINDINGS Brain: No evidence of acute infarction, hemorrhage, hydrocephalus, extra-axial collection or mass lesion/mass effect. Marked brain parenchymal atrophy and white matter microangiopathy. Lacunar infarct in bilateral basal ganglia.  Vascular: Calcific atherosclerotic disease at the skull base. Skull: Normal. Negative for fracture or focal lesion. Sinuses/Orbits: No acute finding. Other: Left frontal scalp hematoma. CT CERVICAL SPINE FINDINGS Alignment: 3 mm posterior listhesis of T1 vertebral body. Skull base and vertebrae: Compression deformity of T1 vertebral body, which is poorly visualized due to motion artifact, however suspicious for acute fracture. Soft tissues and spinal canal: No prevertebral fluid or swelling. No visible canal hematoma. Disc levels: Advanced multilevel osteoarthritic changes of the cervical spine. Upper chest: Large left pleural effusion. Other: None. IMPRESSION: No acute intracranial abnormality. Advanced brain parenchymal atrophy, chronic microvascular disease, chronic bilateral basal ganglia lacunar infarcts. Suspected acute fracture of T1 vertebral body, poorly visualized due to motion artifact, with approximate 30% height loss. 3 mm posterior listhesis of T1 on C7. Multilevel osteoarthritic changes of the cervical spine. Large left pleural effusion. These results were called by telephone at the time of interpretation on 10/30/2017 at 6:22 pm to Dr. Charlesetta Shanks , who verbally acknowledged these results. Electronically Signed   By: Fidela Salisbury M.D.   On: 10/14/2017 18:23   Ct Cervical Spine Wo Contrast  Result Date: 10/24/2017 CLINICAL DATA:  Bilateral periorbital bruising following a fall last night and 3 nights ago. On Coumadin. EXAM: CT HEAD WITHOUT CONTRAST CT MAXILLOFACIAL WITHOUT CONTRAST CT CERVICAL SPINE WITHOUT CONTRAST TECHNIQUE: Multidetector CT imaging of the head, cervical spine, and maxillofacial structures were performed using the standard protocol without intravenous contrast. Multiplanar CT image reconstructions of the cervical spine and maxillofacial structures were also generated. COMPARISON:  None. FINDINGS: CT HEAD FINDINGS Brain: Diffusely enlarged ventricles and subarachnoid  spaces. Patchy white matter low density in both cerebral hemispheres. Old bilateral basal ganglia lacunar infarcts. No intracranial hemorrhage, mass lesion or CT evidence of acute infarction. Vascular: No hyperdense vessel or unexpected calcification. Skull: Mildly limited due to motion degradation. Normal. Negative for fracture or focal lesion. Other: Midline and left frontal scalp hematoma. CT MAXILLOFACIAL FINDINGS Osseous: Nondisplaced fracture of the distal tip of the nasal bone. Mild motion degradation with no additional fractures visualized. The anterior maxillary spine is intact. Orbits: Status post bilateral cataract extraction. Otherwise, normal appearing orbital contents. Sinuses: Normally pneumatized. Soft tissues: Bilateral periorbital soft tissue hematomas. CT CERVICAL SPINE FINDINGS Alignment: Grade 1 anterolisthesis at the C7-T1 level. Skull base and vertebrae: Mildly limited due to motion degradation. No acute fracture. No primary bone lesion or focal pathologic process. Soft tissues and spinal canal: No prevertebral fluid or swelling. No visible canal hematoma. Disc levels: Multilevel degenerative changes, including facet degenerative changes at multiple levels. Upper chest: Mild biapical pleural and parenchymal scarring. Other: Enlarged right lobe of the thyroid gland containing multiple nodules measuring up to 2.1 cm in maximum diameter each. Surgically absent left lobe of the thyroid gland. IMPRESSION: 1. The examinations are mildly limited by patient motion. 2. No skull fracture or intracranial hemorrhage. 3. Nondisplaced fracture of the tip of the nasal bone. 4. No cervical spine fracture or traumatic  subluxation. 5. Bilateral periorbital soft tissue hematomas and midline and left frontal scalp hematomas. 6. Mild to moderate diffuse cerebral and cerebellar atrophy. 7. Extensive chronic small vessel white matter ischemic changes in both cerebral hemispheres. 8. Old bilateral basal ganglia  lacunar infarcts. 9. Multilevel cervical spine degenerative changes with associated grade 1 anterolisthesis at the C7-T1 level. 10. Multinodular thyroid goiter on the right with the largest nodule measuring 2.1 cm. Recommend further evaluation with thyroid ultrasound. If patient is clinically hyperthyroid, consider nuclear medicine thyroid uptake and scan. Electronically Signed   By: Claudie Revering M.D.   On: 10/24/2017 11:38   Dg Chest Portable 1 View  Result Date: 10/23/2017 CLINICAL DATA:  Increased altered mental status. EXAM: PORTABLE CHEST 1 VIEW COMPARISON:  10/24/2017 FINDINGS: Enlarged cardiac silhouette. Calcific atherosclerotic disease of the aorta. Mediastinal contours appear intact. There is no evidence of focal pneumothorax. Interval development of left pleural effusion and left lower lobe airspace consolidation versus atelectasis. Osseous structures are without acute abnormality. Soft tissues are grossly normal. IMPRESSION: Interval development of left pleural effusion and left lower lobe atelectasis versus airspace consolidation. Enlarged cardiac silhouette. Calcific atherosclerotic disease of the aorta. Electronically Signed   By: Fidela Salisbury M.D.   On: 11/02/2017 16:21   Ct Maxillofacial Wo Contrast  Result Date: 10/24/2017 CLINICAL DATA:  Bilateral periorbital bruising following a fall last night and 3 nights ago. On Coumadin. EXAM: CT HEAD WITHOUT CONTRAST CT MAXILLOFACIAL WITHOUT CONTRAST CT CERVICAL SPINE WITHOUT CONTRAST TECHNIQUE: Multidetector CT imaging of the head, cervical spine, and maxillofacial structures were performed using the standard protocol without intravenous contrast. Multiplanar CT image reconstructions of the cervical spine and maxillofacial structures were also generated. COMPARISON:  None. FINDINGS: CT HEAD FINDINGS Brain: Diffusely enlarged ventricles and subarachnoid spaces. Patchy white matter low density in both cerebral hemispheres. Old bilateral basal  ganglia lacunar infarcts. No intracranial hemorrhage, mass lesion or CT evidence of acute infarction. Vascular: No hyperdense vessel or unexpected calcification. Skull: Mildly limited due to motion degradation. Normal. Negative for fracture or focal lesion. Other: Midline and left frontal scalp hematoma. CT MAXILLOFACIAL FINDINGS Osseous: Nondisplaced fracture of the distal tip of the nasal bone. Mild motion degradation with no additional fractures visualized. The anterior maxillary spine is intact. Orbits: Status post bilateral cataract extraction. Otherwise, normal appearing orbital contents. Sinuses: Normally pneumatized. Soft tissues: Bilateral periorbital soft tissue hematomas. CT CERVICAL SPINE FINDINGS Alignment: Grade 1 anterolisthesis at the C7-T1 level. Skull base and vertebrae: Mildly limited due to motion degradation. No acute fracture. No primary bone lesion or focal pathologic process. Soft tissues and spinal canal: No prevertebral fluid or swelling. No visible canal hematoma. Disc levels: Multilevel degenerative changes, including facet degenerative changes at multiple levels. Upper chest: Mild biapical pleural and parenchymal scarring. Other: Enlarged right lobe of the thyroid gland containing multiple nodules measuring up to 2.1 cm in maximum diameter each. Surgically absent left lobe of the thyroid gland. IMPRESSION: 1. The examinations are mildly limited by patient motion. 2. No skull fracture or intracranial hemorrhage. 3. Nondisplaced fracture of the tip of the nasal bone. 4. No cervical spine fracture or traumatic subluxation. 5. Bilateral periorbital soft tissue hematomas and midline and left frontal scalp hematomas. 6. Mild to moderate diffuse cerebral and cerebellar atrophy. 7. Extensive chronic small vessel white matter ischemic changes in both cerebral hemispheres. 8. Old bilateral basal ganglia lacunar infarcts. 9. Multilevel cervical spine degenerative changes with associated grade 1  anterolisthesis at the C7-T1 level. 10. Multinodular thyroid  goiter on the right with the largest nodule measuring 2.1 cm. Recommend further evaluation with thyroid ultrasound. If patient is clinically hyperthyroid, consider nuclear medicine thyroid uptake and scan. Electronically Signed   By: Claudie Revering M.D.   On: 10/24/2017 11:38    Lab Data:  CBC: Recent Labs  Lab 10/28/2017 1553 11/02/17 0448  WBC 18.7* 15.8*  NEUTROABS 16.1*  --   HGB 13.8 11.4*  HCT 43.5 38.2  MCV 99.5 101.6*  PLT 188 081   Basic Metabolic Panel: Recent Labs  Lab 10/19/2017 1545 11/02/17 0448  NA 147* 149*  K 5.9* 3.8  CL 111 117*  CO2 22 19*  GLUCOSE 93 85  BUN 42* 39*  CREATININE 1.05* 1.07*  CALCIUM 8.6* 7.3*   GFR: CrCl cannot be calculated (Unknown ideal weight.). Liver Function Tests: Recent Labs  Lab 11/03/2017 1545 11/02/17 0448  AST 59* 29  ALT 25 22  ALKPHOS 117 94  BILITOT 1.9* 1.0  PROT 6.2* 5.0*  ALBUMIN 3.2* 2.5*   No results for input(s): LIPASE, AMYLASE in the last 168 hours. No results for input(s): AMMONIA in the last 168 hours. Coagulation Profile: Recent Labs  Lab 10/23/2017 1545  INR 2.62   Cardiac Enzymes: Recent Labs  Lab 10/25/2017 1545  CKTOTAL 187   BNP (last 3 results) No results for input(s): PROBNP in the last 8760 hours. HbA1C: No results for input(s): HGBA1C in the last 72 hours. CBG: Recent Labs  Lab 10/22/2017 1547  GLUCAP 99   Lipid Profile: No results for input(s): CHOL, HDL, LDLCALC, TRIG, CHOLHDL, LDLDIRECT in the last 72 hours. Thyroid Function Tests: Recent Labs    10/28/2017 2028  TSH 0.547  FREET4 0.90   Anemia Panel: No results for input(s): VITAMINB12, FOLATE, FERRITIN, TIBC, IRON, RETICCTPCT in the last 72 hours. Urine analysis:    Component Value Date/Time   COLORURINE AMBER (A) 10/24/2017 1625   APPEARANCEUR CLEAR 11/06/2017 1625   LABSPEC 1.027 10/29/2017 1625   PHURINE 5.0 10/16/2017 1625   GLUCOSEU NEGATIVE 10/25/2017  1625   HGBUR NEGATIVE 10/18/2017 1625   BILIRUBINUR NEGATIVE 10/23/2017 1625   KETONESUR NEGATIVE 11/06/2017 1625   PROTEINUR 100 (A) 10/18/2017 1625   NITRITE NEGATIVE 11/04/2017 1625   LEUKOCYTESUR NEGATIVE 10/23/2017 1625     Ripudeep Rai M.D. [facility] 11/02/2017, 11:47 AM  Pager: [phone] Between 7am to 7pm - call Pager - [phone]  After 7pm go to [email or web address] - password [email or web address]  Call night coverage person covering after 7pm

## 2017-11-02 NOTE — ED Notes (Signed)
Spoke with private sitter  She states the last time she saw the patient was on Fri. And she was able to walk with her walker to the couch . States her breathing is the same as it was on friday

## 2017-11-02 NOTE — ED Notes (Signed)
Patient had on a [place] collar switched to an aspen collar for more comfort.

## 2017-11-02 NOTE — ED Notes (Signed)
Pt pastor and another visitor at the bedside. This RN gave pt's daughter update on pt and encouraged family to end vacation early to come visit pt. Pt's pastor's wife also called daughter and encouraged her to come home due to pt current condition being off baseline

## 2017-11-02 NOTE — Progress Notes (Signed)
Patient arrived to the unit from the Emergency Room. Patient arrived with Cardizem drip going at 14mg /hr and  5% Dextrose going at 50 ml/hr. Patient is not alert and [address] Patient is agitated and moaning. Patient is unable to answer any questions. Patient VS 135/90 (104), Heart Rate 141, Resp 23, O2 94%. Patient is in Afib RVR. Patient is hard of hearing.

## 2017-11-02 NOTE — Progress Notes (Signed)
Palliative:  [name] appears extremely uncomfortable in neck collar and on Cardizem infusion. She moans and appears miserable. Private sitter at bedside says that sitter service just began on Friday and that was her first day with [name] prior to today. She says that she was very concerned on Friday about [name]'s health status.   I attempted to reach [name]'s daughter Kelsey [name] at multiple numbers without success. Left voicemail requesting return call. I would favor comfort care for [name].   No charge  Yong ChannelAlicia Quy Lotts, NP Palliative Medicine Team Pager # [phone] (M-F 8a-5p) Team Phone # [phone] (Nights/Weekends)

## 2017-11-02 NOTE — ED Notes (Signed)
Attempted report x1. 

## 2017-11-02 NOTE — ED Notes (Signed)
Pt given new warm blankets, skin tears rewrapped, and cords untangled. Pt repositioned and collar readjusted for pt comfort.

## 2017-11-02 NOTE — ED Notes (Signed)
Pt desat to 84% room air. Placed on 2 L [place]; sats up to 89%. Pt placed on 3 L [place], sats now 95% at this time. Pt resting; home health aid still at bedside.

## 2017-11-02 NOTE — ED Notes (Signed)
Pt's pastor's wife gave this RN her number in the event someone in town needs to be reached.   Kelsey [name]: [phone]

## 2017-11-02 NOTE — ED Notes (Signed)
Critical lab value lactic acid 2.3, RN notified and information read back

## 2017-11-02 NOTE — ED Notes (Signed)
Pulse ox probe placed on pt R ear. O2 sat approx 97% on 4L [place]. Pt personal sitter still at the bedside. Per Diplomatic Services operational officersecretary, pt's son called for update. Will return phone call.

## 2017-11-02 NOTE — ED Notes (Signed)
Patient private sitter at bedside. Patient appears uncomfortable moving around on bed. Medication given

## 2017-11-02 NOTE — ED Notes (Signed)
Pt pulse ox monitor picks up pt O2 reading intermittently due to poor perfusion. Pt O2 sat continues to read at approx 94-98% when held in place. Will continue to monitor.

## 2017-11-02 NOTE — ED Notes (Signed)
Patient is resting on hospital bed, patient private sitter at bedside

## 2017-11-02 NOTE — ED Notes (Signed)
Pt transferred to hospital bed; brief changed. Full linen change.

## 2017-11-03 DIAGNOSIS — Z7189 Other specified counseling: Secondary | ICD-10-CM

## 2017-11-03 DIAGNOSIS — Z515 Encounter for palliative care: Secondary | ICD-10-CM

## 2017-11-03 DIAGNOSIS — R627 Adult failure to thrive: Secondary | ICD-10-CM

## 2017-11-03 LAB — LACTIC ACID, PLASMA: LACTIC ACID, VENOUS: 2.3 mmol/L — AB (ref 0.5–1.9)

## 2017-11-03 LAB — PROCALCITONIN: Procalcitonin: 0.16 ng/mL

## 2017-11-03 MED ORDER — MORPHINE SULFATE (PF) 2 MG/ML IV SOLN
2.0000 mg | INTRAVENOUS | Status: DC
  Administered 2017-11-03 – 2017-11-05 (×10): 2 mg via INTRAVENOUS
  Filled 2017-11-03 (×9): qty 1

## 2017-11-03 MED ORDER — MORPHINE SULFATE (PF) 2 MG/ML IV SOLN: 1.0000 mg | INTRAVENOUS | Status: DC | PRN

## 2017-11-03 MED ORDER — GLYCOPYRROLATE 0.2 MG/ML IJ SOLN
0.2000 mg | INTRAMUSCULAR | Status: DC | PRN
  Administered 2017-11-04: 0.2 mg via INTRAVENOUS
  Filled 2017-11-03 (×3): qty 1

## 2017-11-03 MED ORDER — LORAZEPAM 2 MG/ML IJ SOLN
0.5000 mg | INTRAMUSCULAR | Status: DC | PRN
  Administered 2017-11-03 – 2017-11-04 (×2): 1 mg via INTRAVENOUS
  Filled 2017-11-03 (×2): qty 1

## 2017-11-03 MED ORDER — MORPHINE SULFATE (PF) 4 MG/ML IV SOLN
1.0000 mg | INTRAVENOUS | Status: DC | PRN
Start: 2017-11-03 — End: 2017-11-03
  Administered 2017-11-03: 2 mg via INTRAVENOUS
  Filled 2017-11-03: qty 1

## 2017-11-03 MED ORDER — BIOTENE DRY MOUTH MT LIQD: 15.0000 mL | OROMUCOSAL | Status: DC | PRN

## 2017-11-03 NOTE — Consult Note (Signed)
Consultation Note Date: 11/03/2017   Patient Name: Kelsey [name]  DOB: 06-Jun-1922  MRN: [id number]  Age / Sex: 84 y.o., female  PCP: Kelton Pillar, MD Referring Physician: Mendel Corning, MD  Reason for Consultation: Establishing goals of care  HPI/Patient Profile: 84 y.o. female  with past medical history of atrial fibrillation on Coumadin, COPD, thyroid disease, hypertension, RLS and depression with functional decline over past few months admitted on 11/06/2017 with multiple recent falls, not eating, lethargy and confusion and found to have AF RVR, CAP with pleural effusion. Patient continues to decline and moan in pain. Palliative requested to assist with GOC and symptoms.   Clinical Assessment and Goals of Care: I met today at Kelsey [name] bedside. Sitter at bedside. Kelsey [name] appeared comfortable at first but became restless and moaning as I was in the room.   I called and spoke with daughter, Kelsey [name], who is in [place] and frantically trying to get a flight back home and plans to be here tomorrow. She is very tearful and wants to get to her mother before she passes if at all possible. She understands poor prognosis and that her mother is miserable and at EOL. We discussed goal to continue some interventions with hope to prolong life at least until Kelsey [name] can be at bedside as well as providing more comfort to her mother. I explained that I will adjust medications as her mother is restless and moaning again and she agrees and is appreciative. Offered emotional support and shared my contact information with Kelsey [name] for any further questions/concerns. Reassured her that we will take good care of her mother while she is here.   Discussed plan with RN.   Primary Decision Maker NEXT OF KIN daughter Kelsey [name]    SUMMARY OF RECOMMENDATIONS   - Continue cardizem/IVF/abx until daughter arrives - Scheduled  morphine for better symptom control   Code Status/Advance Care Planning:  DNR   Symptom Management:   Pain: Morphine IV 2 mg every 4 hours scheduled. Morphine IV 1-2 mg every 2 hours prn. May increase dose/frequency as needed to achieve comfort.   Agitation/anxiety: Ativan IV 0.5-1 mg every 4 hours prn.   D/C aspen collar for comfort.  Palliative Prophylaxis:   Delirium Protocol, Frequent Pain Assessment, Oral Care and Turn Reposition  Additional Recommendations (Limitations, Scope, Preferences):  Full Comfort Care  Psycho-social/Spiritual:   Desire for further Chaplaincy support:no  Additional Recommendations: Grief/Bereavement Support  Prognosis:   Hours - Days  Discharge Planning: Anticipated Hospital Death      Primary Diagnoses: Present on Admission: . Atrial fibrillation with RVR ([place]) . Chronic diastolic heart failure ([place]) . COPD (chronic obstructive pulmonary disease) ([place]) . Essential hypertension . Pleural effusion . Hypernatremia . Hyperkalemia . Acute encephalopathy . Community acquired pneumonia . Hyperthyroidism   I have reviewed the medical record, interviewed the patient and family, and examined the patient. The following aspects are pertinent.  Past Medical History:  Diagnosis Date  . Asthma   . Atrial fibrillation ([place])   .  COPD (chronic obstructive pulmonary disease) ([place])   . Depression   . GERD (gastroesophageal reflux disease)   . Hyperlipidemia   . Hypothyroidism   . Osteoporosis   . Restless leg syndrome   . Seasonal allergies    Social History   Socioeconomic History  . Marital status: Widowed    Spouse name: None  . Number of children: None  . Years of education: None  . Highest education level: None  Social Needs  . Financial resource strain: None  . Food insecurity - worry: None  . Food insecurity - inability: None  . Transportation needs - medical: None  . Transportation needs - non-medical: None    Occupational History  . None  Tobacco Use  . Smoking status: Former Research scientist (life sciences)  . Smokeless tobacco: Never Used  . Tobacco comment: QUIT  Substance and Sexual Activity  . Alcohol use: No  . Drug use: None  . Sexual activity: None  Other Topics Concern  . None  Social History Narrative  . None   Family History  Problem Relation Age of Onset  . Sudden Cardiac Death Neg Hx    Scheduled Meds: .  morphine injection  2 mg Intravenous Q4H   Continuous Infusions: . azithromycin Stopped (11/02/17 2334)  . cefTRIAXone (ROCEPHIN)  IV Stopped (11/02/17 2209)  . dextrose 5 % and 0.45% NaCl 50 mL/hr at 11/02/17 1233  . diltiazem (CARDIZEM) infusion 14 mg/hr (11/03/17 0158)   PRN Meds:.albuterol, antiseptic oral rinse, LORazepam, morphine injection, ondansetron (ZOFRAN) IV, polyvinyl alcohol Allergies  Allergen Reactions  . Codeine Nausea And Vomiting  . Morphine And Related Nausea Only  . Promethazine Other (See Comments)    Hallucinations   . Tape Other (See Comments)    SKIN IS VERY THIN AND WILL BRUISE AND TEAR EASILY; PLEASE USE PAPER TAPE!!   Review of Systems  Unable to perform ROS   Physical Exam  Constitutional: She appears well-developed. She appears toxic. She has a sickly appearance.  Elderly, thin, frail Bruised all over  HENT:  Head: Normocephalic and atraumatic.  Cardiovascular: An irregularly irregular rhythm present. Tachycardia present.  Pulmonary/Chest: No accessory muscle usage. No tachypnea. No respiratory distress. She has decreased breath sounds. She has rhonchi.  Abdominal: Soft. Normal appearance.  Neurological: She is disoriented.  Somnolent   Nursing note and vitals reviewed.   Vital Signs: BP 99/70 (BP Location: Left Leg)   Pulse (!) 102   Temp (!) 97.3 F (36.3 C) (Axillary)   Resp 17   Wt 71.4 kg (157 lb 6.5 oz)   SpO2 99%   BMI 28.79 kg/m  Pain Assessment: PAINAD       SpO2: SpO2: 99 % O2 Device:SpO2: 99 % O2 Flow Rate: .O2 Flow  Rate (L/min): 3.5 L/min  IO: Intake/output summary:   Intake/Output Summary (Last 24 hours) at 11/03/2017 6734 Last data filed at 11/03/2017 0405 Gross per 24 hour  Intake 1188.67 ml  Output -  Net 1188.67 ml    LBM: Last BM Date: (UTA) Baseline Weight: Weight: 71.4 kg (157 lb 6.5 oz) Most recent weight: Weight: 71.4 kg (157 lb 6.5 oz)     Palliative Assessment/Data: 10%    Time Total: 50 min  Greater than 50%  of this time was spent counseling and coordinating care related to the above assessment and plan.  Signed by: Vinie Sill, NP Palliative Medicine Team Pager # [phone] (M-F 8a-5p) Team Phone # [phone] (Nights/Weekends)

## 2017-11-03 NOTE — Progress Notes (Signed)
CRITICAL VALUE ALERT  Critical Value:  2.3  Date & Time Notied:  11/03/2017 0815  Provider Notified: Dr. Isidoro Donningai   Orders Received/Actions taken: Continue with Fluids, Cardizem drip and antibiotics. Will continue to try to contact daughter who is out of town.

## 2017-11-03 NOTE — Progress Notes (Signed)
SLP Cancellation Note  Patient Details Name: Kelsey [name] MRN: [id number] DOB: 05-09-1922   Cancelled Evaluation: Orders received for swallow evaluation, but orders were discontinued. Please reconsult if needs arise. Thank you!  Maleena Eddleman B. Murvin NatalBueche, [facility] CenterMSP, CCC-SLP Speech Language Pathologist [phone]  Leigh AuroraBueche, Bryna Razavi Brown 11/03/2017, 9:34 AM

## 2017-11-03 NOTE — Progress Notes (Addendum)
[facility]                                                                              Patient Demographics  Kelsey [name], is a 84 y.o. female, DOB - [birth year], [place] date - 10/31/2017   Admitting Physician Vianne Bulls, MD  Outpatient Primary MD for the patient is Kelton Pillar, MD  Outpatient specialists:   LOS - 2  days   Medical records reviewed and are as summarized below:    Chief Complaint  Patient presents with  . Altered Mental Status       Brief summary  Kelsey [name] is a 84 y.o. female with medical history significant for atrial fibrillation on Coumadin, COPD, thyroid disease, hypertension, and depression, presented to ED with increased confusion, lethargy, food refusal, and multiple falls. Patient lives at home where she is cared for by a home health aide.  Patient had multiple falls over the past couple weeks and has been increasingly confused, refusing food or drink, and less active for the past 4-5 days.  Per caregiver, she has been groaning in apparent pain.  Patient was recently treated with Ceftin for a UTI and had completed the course.   In ED, patient was tachycardia, A. fib with RVR, BP low 90/56.  EKG showed A. fib with RVR with heart rate 141.  Chest x-ray showed left-sided pleural effusion with left lower lobe pneumonia or atelectasis.  Patient was admitted for further workup.      Assessment & Plan    Principal Problem:   Acute metabolic encephalopathy with recurrent falls -Per family, has been declining for the last couple of months  - CT head negative for acute intracranial abnormality -UA negative for UTI, continue IV Zithromax, Rocephin - No significant improvement in the last 24 hours, patient moaning with pain and discomfort, lethargic  Active Problems:   Atrial fibrillation with RVR (HCC) rate 120-140 range in ED, history of chronic atrial fibrillation -Initially was treated with IV Lopressor in  ED, no improvement -Started on Cardizem infusion, will continue for now, heart rate still not well controlled, possibly pain contributing to that -Patient was anticoagulated with Coumadin PTA, however having recent multiple falls, not a good candidate for Coumadin anymore, risk outweighs benefit at this time    COPD (chronic obstructive pulmonary disease) (HCC) - Continue duo nebs as needed -  Pulmonary hygiene, O2 supplementation  Community-acquired pneumonia, left-sided pleural effusion -Patient met Sirs/sepsis criteria given leukocytosis, hypotension, tachycardia, pneumonia - Follow blood cultures, follow sputum cultures, urine strep antigen, urine-Legionella - Continue IV Zithromax, Rocephin  - Overall poor prognosis  Hypernatremia, hyperkalemia, lactic acidosis - In the setting of dehydration-continue gentle IV fluid hydration, change to D5 half-normal. -Potassium improved    Essential hypertension - Currently on Cardizem drip    History of Hyperthyroidism  - has not been taking Tapazole, TSH 0.5, free T4 0.9   Code Status: dnr  DVT Prophylaxis:   SCD's Family Communication: Discussed in detail with patient's daughter on the phone, who is currently in [place]. Explained the imagings, labs, patient overall not doing well, poor prognosis, may  not survive this hospitalization. Daughter is agreeable with comfort care. She does not however want any morphine drip until she gets back to [facility]. She is okay with PRN medications for comfort. Okay to remove the aspen collar and continue pain control. Called palliative care, Lars Mage, who will discuss the full comfort care goals with the patient's daughter. Continue DNR/DNI.    Disposition Plan:  Time Spent in minutes  35 minutes  Procedures:  None   Consultants:   Palliative medicine  Antimicrobials:    IV Zithromax 1/20  IV Rocephin 1/20   Medications  Scheduled Meds: .  morphine injection  2 mg Intravenous  Q4H   Continuous Infusions: . azithromycin Stopped (11/02/17 2334)  . cefTRIAXone (ROCEPHIN)  IV Stopped (11/02/17 2209)  . dextrose 5 % and 0.45% NaCl 75 mL/hr at 11/03/17 0917  . diltiazem (CARDIZEM) infusion 14 mg/hr (11/03/17 0158)   PRN Meds:.albuterol, antiseptic oral rinse, LORazepam, morphine injection, ondansetron (ZOFRAN) IV, polyvinyl alcohol   Antibiotics   Anti-infectives (From admission, onward)   Start     Dose/Rate Route Frequency Ordered Stop   10/31/2017 2015  cefTRIAXone (ROCEPHIN) 1 g in dextrose 5 % 50 mL IVPB     1 g 100 mL/hr over 30 Minutes Intravenous Every 24 hours 10/18/2017 2013 11/08/17 2014   10/20/2017 2015  azithromycin (ZITHROMAX) 500 mg in dextrose 5 % 250 mL IVPB     500 mg 250 mL/hr over 60 Minutes Intravenous Every 24 hours 10/22/2017 2013 11/08/17 2014        Subjective:   Kelsey [name] was seen and examined today.  Appears to be in discomfort with aspen collar on, lethargic. Temp 96.15F. Caregiver is at the bedside. Moaning in pain (?), Shallow breathing.   Objective:   Vitals:   11/03/17 0547 11/03/17 0836 11/03/17 0900 11/03/17 0958  BP: (!) 120/100 99/70  102/75  Pulse: (!) 116 (!) 102    Resp: (!) 24 17    Temp: (!) 96.3 F (35.7 C) (!) 97.3 F (36.3 C)  (!) 96.1 F (35.6 C)  TempSrc: Axillary Axillary Oral Oral  SpO2: 98% 99%  98%  Weight: 71.4 kg (157 lb 6.5 oz)       Intake/Output Summary (Last 24 hours) at 11/03/2017 1134 Last data filed at 11/03/2017 0405 Gross per 24 hour  Intake 1188.67 ml  Output -  Net 1188.67 ml     Wt Readings from Last 3 Encounters:  11/03/17 71.4 kg (157 lb 6.5 oz)  10/26/13 69.4 kg (153 lb)  12/16/11 65.8 kg (145 lb)     Exam  General: Somnolent, lethargic, uncomfortable Eyes:  HEENT: collar + Cardiovascular: S1-S2 auscultated, irregular rate, tachycardia Respiratory: Coarse breath sounds anteriorly Gastrointestinal: Soft, normal bowel sounds Ext: no pedal edema bilaterally Neuro:  unable to assess Musculoskeletal: No digital cyanosis, clubbing Skin: No rashes Psych: lethargic     Data Reviewed:  I have personally reviewed following labs and imaging studies  Micro Results Recent Results (from the past 240 hour(s))  Urine culture     Status: Abnormal   Collection Time: 10/24/17  2:08 PM  Result Value Ref Range Status   Specimen Description URINE, CLEAN CATCH  Final   Special Requests NONE  Final   Culture >=100,000 COLONIES/mL ESCHERICHIA COLI (A)  Final   Report Status 10/26/2017 FINAL  Final   Organism ID, Bacteria ESCHERICHIA COLI (A)  Final      Susceptibility   Escherichia coli - MIC*  AMPICILLIN 4 SENSITIVE Sensitive     CEFAZOLIN <=4 SENSITIVE Sensitive     CEFTRIAXONE <=1 SENSITIVE Sensitive     CIPROFLOXACIN <=0.25 SENSITIVE Sensitive     GENTAMICIN <=1 SENSITIVE Sensitive     IMIPENEM <=0.25 SENSITIVE Sensitive     NITROFURANTOIN 64 INTERMEDIATE Intermediate     TRIMETH/SULFA <=20 SENSITIVE Sensitive     AMPICILLIN/SULBACTAM <=2 SENSITIVE Sensitive     PIP/TAZO <=4 SENSITIVE Sensitive     Extended ESBL NEGATIVE Sensitive     * >=100,000 COLONIES/mL ESCHERICHIA COLI  Blood culture (routine x 2)     Status: None (Preliminary result)   Collection Time: 11/08/2017  3:45 PM  Result Value Ref Range Status   Specimen Description BLOOD BLOOD LEFT HAND  Final   Special Requests   Final    BOTTLES DRAWN AEROBIC AND ANAEROBIC Blood Culture adequate volume   Culture NO GROWTH 2 DAYS  Final   Report Status PENDING  Incomplete  Urine culture     Status: None   Collection Time: 10/13/2017  4:25 PM  Result Value Ref Range Status   Specimen Description URINE, RANDOM  Final   Special Requests NONE  Final   Culture NO GROWTH  Final   Report Status 11/02/2017 FINAL  Final  Blood culture (routine x 2)     Status: None (Preliminary result)   Collection Time: 10/25/2017  6:25 PM  Result Value Ref Range Status   Specimen Description BLOOD BLOOD RIGHT HAND   Final   Special Requests IN PEDIATRIC BOTTLE Blood Culture adequate volume  Final   Culture NO GROWTH 2 DAYS  Final   Report Status PENDING  Incomplete    Radiology Reports Ct Abdomen Pelvis Wo Contrast  Result Date: 10/17/2017 CLINICAL DATA:  Altered mental status, falls, uncooperative patient. EXAM: CT CHEST, ABDOMEN AND PELVIS WITHOUT CONTRAST TECHNIQUE: Multidetector CT imaging of the chest, abdomen and pelvis was performed following the standard protocol without IV contrast. COMPARISON:  Chest radiograph 10/24/2017, 10/19/2017, head 120 19 FINDINGS: CT CHEST FINDINGS Cardiovascular: Coronary artery calcification and aortic atherosclerotic calcification. No pericardial effusion. Mediastinum/Nodes: No axillary or supraclavicular adenopathy. No mediastinal hilar adenopathy. Lungs/Pleura: Bilateral moderate pleural effusions. There is linear bands of atelectasis and potential loculated effusion within the lingula. No clear mass lesion. Ground-glass nodule in the RIGHT lower lobe measures 11 mm (image 23, series 3 Musculoskeletal: No evidence of rib fracture. Osteopenia. Severe degenerate changes shoulders. Chronic appearing compression fracture at T2 and T5 CT ABDOMEN AND PELVIS FINDINGS Hepatobiliary: 2 low-density lesions in the liver cannot be characterized without IV contrast. Central RIGHT hepatic lobe measures 12 mm and and LEFT lateral hepatic lobe measuring 9 mm both on image 50, series 3. Gallbladder normal. Pancreas: Pancreas is normal. No ductal dilatation. No pancreatic inflammation. Spleen: Normal spleen Adrenals/urinary tract: Adrenal glands and kidneys are normal. The ureters and bladder normal. Stomach/Bowel: The entire stomach is contained within a large hiatal hernia which extends into the posterior mediastinum behind the heart and into the LEFT hemithorax. The stomach is inverted such that the cardia is below the antrum (organoaxial volvulus). No evidence of high-grade obstruction.  Duodenum small bowel are normal. Colon rectosigmoid colon normal. Vascular/Lymphatic: Abdominal aorta is normal caliber. There is no retroperitoneal or periportal lymphadenopathy. No pelvic lymphadenopathy. Reproductive: Post hysterectomy Other: No free fluid. Musculoskeletal: LEFT hip prosthetic. Severe osteopenia of the bones. Bulky osteophytosis IMPRESSION: Chest Impression: 1. Bilateral pleural effusions. 2. Atelectasis and loculated effusion in the LEFT lung in part  related to the large hiatal hernia. No acute findings or malignancy identified. 3. Ground-glass nodule in the RIGHT lower lobe may represent small focus of infection. Low-grade neoplasm cannot be excluded. 4. Chronic compression fracture the upper thoracic spine. Abdomen / Pelvis Impression: 1. Large hiatal hernia with the entire stomach above the diaphragm within a large hernia sac . 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 2. No acute findings in the abdomen pelvis. 3. Two hypodense lesions in liver cannot be characterized. 4. Severe degenerative arthropathy of the lumbar spine. Electronically Signed   By: Suzy Bouchard M.D.   On: 11/09/2017 18:20   Dg Ribs Unilateral W/chest Left  Result Date: 10/24/2017 CLINICAL DATA:  Fall, left lateral chest pain EXAM: LEFT RIBS AND CHEST - 3+ VIEW COMPARISON:  11/25/2006 FINDINGS: Cardiomegaly. Left lower lobe atelectasis. No visible left rib fracture. No effusion or pneumothorax. IMPRESSION: Cardiomegaly.  Left lower lobe atelectasis. No visible rib fracture. Electronically Signed   By: Rolm Baptise M.D.   On: 10/24/2017 10:49   Dg Tibia/fibula Left  Result Date: 10/17/2017 CLINICAL DATA:  84 year old female status post multiple falls EXAM: LEFT TIBIA AND FIBULA - 2 VIEW COMPARISON:  Concurrently obtained radiographs of the right tib-fib FINDINGS: Surgical changes of prior total knee arthroplasty without evidence of  hardware complication or periprosthetic fracture. No acute fracture or malalignment identified. The bones appear osteopenic. IMPRESSION: Negative. Electronically Signed   By: Jacqulynn Cadet M.D.   On: 10/28/2017 18:30   Dg Tibia/fibula Right  Result Date: 10/24/2017 CLINICAL DATA:  84 year old female status post fall EXAM: RIGHT TIBIA AND FIBULA - 2 VIEW COMPARISON:  Concurrently obtained radiographs of the left tib-fib FINDINGS: Surgical changes of prior total knee arthroplasty without evidence of hardware complication. No acute fracture or malalignment. The bones appear osteopenic. Soft tissues are unremarkable. IMPRESSION: Negative. Electronically Signed   By: Jacqulynn Cadet M.D.   On: 10/18/2017 18:30   Ct Head Wo Contrast  Result Date: 10/31/2017 CLINICAL DATA:  Altered mental status.  Multiple falls. EXAM: CT HEAD WITHOUT CONTRAST CT CERVICAL SPINE WITHOUT CONTRAST TECHNIQUE: Multidetector CT imaging of the head and cervical spine was performed following the standard protocol without intravenous contrast. Multiplanar CT image reconstructions of the cervical spine were also generated. COMPARISON:  None. FINDINGS: CT HEAD FINDINGS Brain: No evidence of acute infarction, hemorrhage, hydrocephalus, extra-axial collection or mass lesion/mass effect. Marked brain parenchymal atrophy and white matter microangiopathy. Lacunar infarct in bilateral basal ganglia. Vascular: Calcific atherosclerotic disease at the skull base. Skull: Normal. Negative for fracture or focal lesion. Sinuses/Orbits: No acute finding. Other: Left frontal scalp hematoma. CT CERVICAL SPINE FINDINGS Alignment: 3 mm posterior listhesis of T1 vertebral body. Skull base and vertebrae: Compression deformity of T1 vertebral body, which is poorly visualized due to motion artifact, however suspicious for acute fracture. Soft tissues and spinal canal: No prevertebral fluid or swelling. No visible canal hematoma. Disc levels: Advanced  multilevel osteoarthritic changes of the cervical spine. Upper chest: Large left pleural effusion. Other: None. IMPRESSION: No acute intracranial abnormality. Advanced brain parenchymal atrophy, chronic microvascular disease, chronic bilateral basal ganglia lacunar infarcts. Suspected acute fracture of T1 vertebral body, poorly visualized due to motion artifact, with approximate 30% height loss. 3 mm posterior listhesis of T1 on C7. Multilevel osteoarthritic changes of the cervical spine. Large left pleural effusion. These results were called by telephone at the time of interpretation on 10/19/2017 at 6:22 pm  to Dr. Charlesetta Shanks , who verbally acknowledged these results. Electronically Signed   By: Fidela Salisbury M.D.   On: 10/17/2017 18:23   Ct Head Wo Contrast  Result Date: 10/24/2017 CLINICAL DATA:  Bilateral periorbital bruising following a fall last night and 3 nights ago. On Coumadin. EXAM: CT HEAD WITHOUT CONTRAST CT MAXILLOFACIAL WITHOUT CONTRAST CT CERVICAL SPINE WITHOUT CONTRAST TECHNIQUE: Multidetector CT imaging of the head, cervical spine, and maxillofacial structures were performed using the standard protocol without intravenous contrast. Multiplanar CT image reconstructions of the cervical spine and maxillofacial structures were also generated. COMPARISON:  None. FINDINGS: CT HEAD FINDINGS Brain: Diffusely enlarged ventricles and subarachnoid spaces. Patchy white matter low density in both cerebral hemispheres. Old bilateral basal ganglia lacunar infarcts. No intracranial hemorrhage, mass lesion or CT evidence of acute infarction. Vascular: No hyperdense vessel or unexpected calcification. Skull: Mildly limited due to motion degradation. Normal. Negative for fracture or focal lesion. Other: Midline and left frontal scalp hematoma. CT MAXILLOFACIAL FINDINGS Osseous: Nondisplaced fracture of the distal tip of the nasal bone. Mild motion degradation with no additional fractures visualized. The  anterior maxillary spine is intact. Orbits: Status post bilateral cataract extraction. Otherwise, normal appearing orbital contents. Sinuses: Normally pneumatized. Soft tissues: Bilateral periorbital soft tissue hematomas. CT CERVICAL SPINE FINDINGS Alignment: Grade 1 anterolisthesis at the C7-T1 level. Skull base and vertebrae: Mildly limited due to motion degradation. No acute fracture. No primary bone lesion or focal pathologic process. Soft tissues and spinal canal: No prevertebral fluid or swelling. No visible canal hematoma. Disc levels: Multilevel degenerative changes, including facet degenerative changes at multiple levels. Upper chest: Mild biapical pleural and parenchymal scarring. Other: Enlarged right lobe of the thyroid gland containing multiple nodules measuring up to 2.1 cm in maximum diameter each. Surgically absent left lobe of the thyroid gland. IMPRESSION: 1. The examinations are mildly limited by patient motion. 2. No skull fracture or intracranial hemorrhage. 3. Nondisplaced fracture of the tip of the nasal bone. 4. No cervical spine fracture or traumatic subluxation. 5. Bilateral periorbital soft tissue hematomas and midline and left frontal scalp hematomas. 6. Mild to moderate diffuse cerebral and cerebellar atrophy. 7. Extensive chronic small vessel white matter ischemic changes in both cerebral hemispheres. 8. Old bilateral basal ganglia lacunar infarcts. 9. Multilevel cervical spine degenerative changes with associated grade 1 anterolisthesis at the C7-T1 level. 10. Multinodular thyroid goiter on the right with the largest nodule measuring 2.1 cm. Recommend further evaluation with thyroid ultrasound. If patient is clinically hyperthyroid, consider nuclear medicine thyroid uptake and scan. Electronically Signed   By: Claudie Revering M.D.   On: 10/24/2017 11:38   Ct Chest Wo Contrast  Result Date: 11/03/2017 CLINICAL DATA:  Altered mental status, falls, uncooperative patient. EXAM: CT CHEST,  ABDOMEN AND PELVIS WITHOUT CONTRAST TECHNIQUE: Multidetector CT imaging of the chest, abdomen and pelvis was performed following the standard protocol without IV contrast. COMPARISON:  Chest radiograph 10/24/2017, 10/18/2017, head 120 19 FINDINGS: CT CHEST FINDINGS Cardiovascular: Coronary artery calcification and aortic atherosclerotic calcification. No pericardial effusion. Mediastinum/Nodes: No axillary or supraclavicular adenopathy. No mediastinal hilar adenopathy. Lungs/Pleura: Bilateral moderate pleural effusions. There is linear bands of atelectasis and potential loculated effusion within the lingula. No clear mass lesion. Ground-glass nodule in the RIGHT lower lobe measures 11 mm (image 23, series 3 Musculoskeletal: No evidence of rib fracture. Osteopenia. Severe degenerate changes shoulders. Chronic appearing compression fracture at T2 and T5 CT ABDOMEN AND PELVIS FINDINGS Hepatobiliary: 2 low-density lesions in the liver cannot  be characterized without IV contrast. Central RIGHT hepatic lobe measures 12 mm and and LEFT lateral hepatic lobe measuring 9 mm both on image 50, series 3. Gallbladder normal. Pancreas: Pancreas is normal. No ductal dilatation. No pancreatic inflammation. Spleen: Normal spleen Adrenals/urinary tract: Adrenal glands and kidneys are normal. The ureters and bladder normal. Stomach/Bowel: The entire stomach is contained within a large hiatal hernia which extends into the posterior mediastinum behind the heart and into the LEFT hemithorax. The stomach is inverted such that the cardia is below the antrum (organoaxial volvulus). No evidence of high-grade obstruction. Duodenum small bowel are normal. Colon rectosigmoid colon normal. Vascular/Lymphatic: Abdominal aorta is normal caliber. There is no retroperitoneal or periportal lymphadenopathy. No pelvic lymphadenopathy. Reproductive: Post hysterectomy Other: No free fluid. Musculoskeletal: LEFT hip prosthetic. Severe osteopenia of the  bones. Bulky osteophytosis IMPRESSION: Chest Impression: 1. Bilateral pleural effusions. 2. Atelectasis and loculated effusion in the LEFT lung in part related to the large hiatal hernia. No acute findings or malignancy identified. 3. Ground-glass nodule in the RIGHT lower lobe may represent small focus of infection. Low-grade neoplasm cannot be excluded. 4. Chronic compression fracture the upper thoracic spine. Abdomen / Pelvis Impression: 1. Large hiatal hernia with the entire stomach above the diaphragm within a large hernia sac . 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 2. No acute findings in the abdomen pelvis. 3. Two hypodense lesions in liver cannot be characterized. 4. Severe degenerative arthropathy of the lumbar spine. Electronically Signed   By: Suzy Bouchard M.D.   On: 10/29/2017 18:20   Ct Cervical Spine Wo Contrast  Result Date: 11/08/2017 CLINICAL DATA:  Altered mental status.  Multiple falls. EXAM: CT HEAD WITHOUT CONTRAST CT CERVICAL SPINE WITHOUT CONTRAST TECHNIQUE: Multidetector CT imaging of the head and cervical spine was performed following the standard protocol without intravenous contrast. Multiplanar CT image reconstructions of the cervical spine were also generated. COMPARISON:  None. FINDINGS: CT HEAD FINDINGS Brain: No evidence of acute infarction, hemorrhage, hydrocephalus, extra-axial collection or mass lesion/mass effect. Marked brain parenchymal atrophy and white matter microangiopathy. Lacunar infarct in bilateral basal ganglia. Vascular: Calcific atherosclerotic disease at the skull base. Skull: Normal. Negative for fracture or focal lesion. Sinuses/Orbits: No acute finding. Other: Left frontal scalp hematoma. CT CERVICAL SPINE FINDINGS Alignment: 3 mm posterior listhesis of T1 vertebral body. Skull base and vertebrae: Compression deformity of T1 vertebral body, which is poorly visualized due to  motion artifact, however suspicious for acute fracture. Soft tissues and spinal canal: No prevertebral fluid or swelling. No visible canal hematoma. Disc levels: Advanced multilevel osteoarthritic changes of the cervical spine. Upper chest: Large left pleural effusion. Other: None. IMPRESSION: No acute intracranial abnormality. Advanced brain parenchymal atrophy, chronic microvascular disease, chronic bilateral basal ganglia lacunar infarcts. Suspected acute fracture of T1 vertebral body, poorly visualized due to motion artifact, with approximate 30% height loss. 3 mm posterior listhesis of T1 on C7. Multilevel osteoarthritic changes of the cervical spine. Large left pleural effusion. These results were called by telephone at the time of interpretation on 11/03/2017 at 6:22 pm to Dr. Charlesetta Shanks , who verbally acknowledged these results. Electronically Signed   By: Fidela Salisbury M.D.   On: 11/07/2017 18:23   Ct Cervical Spine Wo Contrast  Result Date: 10/24/2017 CLINICAL DATA:  Bilateral periorbital bruising following a fall last night and 3 nights ago. On Coumadin. EXAM: CT HEAD WITHOUT CONTRAST CT MAXILLOFACIAL WITHOUT CONTRAST CT  CERVICAL SPINE WITHOUT CONTRAST TECHNIQUE: Multidetector CT imaging of the head, cervical spine, and maxillofacial structures were performed using the standard protocol without intravenous contrast. Multiplanar CT image reconstructions of the cervical spine and maxillofacial structures were also generated. COMPARISON:  None. FINDINGS: CT HEAD FINDINGS Brain: Diffusely enlarged ventricles and subarachnoid spaces. Patchy white matter low density in both cerebral hemispheres. Old bilateral basal ganglia lacunar infarcts. No intracranial hemorrhage, mass lesion or CT evidence of acute infarction. Vascular: No hyperdense vessel or unexpected calcification. Skull: Mildly limited due to motion degradation. Normal. Negative for fracture or focal lesion. Other: Midline and left  frontal scalp hematoma. CT MAXILLOFACIAL FINDINGS Osseous: Nondisplaced fracture of the distal tip of the nasal bone. Mild motion degradation with no additional fractures visualized. The anterior maxillary spine is intact. Orbits: Status post bilateral cataract extraction. Otherwise, normal appearing orbital contents. Sinuses: Normally pneumatized. Soft tissues: Bilateral periorbital soft tissue hematomas. CT CERVICAL SPINE FINDINGS Alignment: Grade 1 anterolisthesis at the C7-T1 level. Skull base and vertebrae: Mildly limited due to motion degradation. No acute fracture. No primary bone lesion or focal pathologic process. Soft tissues and spinal canal: No prevertebral fluid or swelling. No visible canal hematoma. Disc levels: Multilevel degenerative changes, including facet degenerative changes at multiple levels. Upper chest: Mild biapical pleural and parenchymal scarring. Other: Enlarged right lobe of the thyroid gland containing multiple nodules measuring up to 2.1 cm in maximum diameter each. Surgically absent left lobe of the thyroid gland. IMPRESSION: 1. The examinations are mildly limited by patient motion. 2. No skull fracture or intracranial hemorrhage. 3. Nondisplaced fracture of the tip of the nasal bone. 4. No cervical spine fracture or traumatic subluxation. 5. Bilateral periorbital soft tissue hematomas and midline and left frontal scalp hematomas. 6. Mild to moderate diffuse cerebral and cerebellar atrophy. 7. Extensive chronic small vessel white matter ischemic changes in both cerebral hemispheres. 8. Old bilateral basal ganglia lacunar infarcts. 9. Multilevel cervical spine degenerative changes with associated grade 1 anterolisthesis at the C7-T1 level. 10. Multinodular thyroid goiter on the right with the largest nodule measuring 2.1 cm. Recommend further evaluation with thyroid ultrasound. If patient is clinically hyperthyroid, consider nuclear medicine thyroid uptake and scan. Electronically  Signed   By: Claudie Revering M.D.   On: 10/24/2017 11:38   Dg Chest Portable 1 View  Result Date: 10/16/2017 CLINICAL DATA:  Increased altered mental status. EXAM: PORTABLE CHEST 1 VIEW COMPARISON:  10/24/2017 FINDINGS: Enlarged cardiac silhouette. Calcific atherosclerotic disease of the aorta. Mediastinal contours appear intact. There is no evidence of focal pneumothorax. Interval development of left pleural effusion and left lower lobe airspace consolidation versus atelectasis. Osseous structures are without acute abnormality. Soft tissues are grossly normal. IMPRESSION: Interval development of left pleural effusion and left lower lobe atelectasis versus airspace consolidation. Enlarged cardiac silhouette. Calcific atherosclerotic disease of the aorta. Electronically Signed   By: Fidela Salisbury M.D.   On: 10/26/2017 16:21   Ct Maxillofacial Wo Contrast  Result Date: 10/24/2017 CLINICAL DATA:  Bilateral periorbital bruising following a fall last night and 3 nights ago. On Coumadin. EXAM: CT HEAD WITHOUT CONTRAST CT MAXILLOFACIAL WITHOUT CONTRAST CT CERVICAL SPINE WITHOUT CONTRAST TECHNIQUE: Multidetector CT imaging of the head, cervical spine, and maxillofacial structures were performed using the standard protocol without intravenous contrast. Multiplanar CT image reconstructions of the cervical spine and maxillofacial structures were also generated. COMPARISON:  None. FINDINGS: CT HEAD FINDINGS Brain: Diffusely enlarged ventricles and subarachnoid spaces. Patchy white matter low density in both cerebral hemispheres.  Old bilateral basal ganglia lacunar infarcts. No intracranial hemorrhage, mass lesion or CT evidence of acute infarction. Vascular: No hyperdense vessel or unexpected calcification. Skull: Mildly limited due to motion degradation. Normal. Negative for fracture or focal lesion. Other: Midline and left frontal scalp hematoma. CT MAXILLOFACIAL FINDINGS Osseous: Nondisplaced fracture of the  distal tip of the nasal bone. Mild motion degradation with no additional fractures visualized. The anterior maxillary spine is intact. Orbits: Status post bilateral cataract extraction. Otherwise, normal appearing orbital contents. Sinuses: Normally pneumatized. Soft tissues: Bilateral periorbital soft tissue hematomas. CT CERVICAL SPINE FINDINGS Alignment: Grade 1 anterolisthesis at the C7-T1 level. Skull base and vertebrae: Mildly limited due to motion degradation. No acute fracture. No primary bone lesion or focal pathologic process. Soft tissues and spinal canal: No prevertebral fluid or swelling. No visible canal hematoma. Disc levels: Multilevel degenerative changes, including facet degenerative changes at multiple levels. Upper chest: Mild biapical pleural and parenchymal scarring. Other: Enlarged right lobe of the thyroid gland containing multiple nodules measuring up to 2.1 cm in maximum diameter each. Surgically absent left lobe of the thyroid gland. IMPRESSION: 1. The examinations are mildly limited by patient motion. 2. No skull fracture or intracranial hemorrhage. 3. Nondisplaced fracture of the tip of the nasal bone. 4. No cervical spine fracture or traumatic subluxation. 5. Bilateral periorbital soft tissue hematomas and midline and left frontal scalp hematomas. 6. Mild to moderate diffuse cerebral and cerebellar atrophy. 7. Extensive chronic small vessel white matter ischemic changes in both cerebral hemispheres. 8. Old bilateral basal ganglia lacunar infarcts. 9. Multilevel cervical spine degenerative changes with associated grade 1 anterolisthesis at the C7-T1 level. 10. Multinodular thyroid goiter on the right with the largest nodule measuring 2.1 cm. Recommend further evaluation with thyroid ultrasound. If patient is clinically hyperthyroid, consider nuclear medicine thyroid uptake and scan. Electronically Signed   By: Claudie Revering M.D.   On: 10/24/2017 11:38    Lab Data:  CBC: Recent Labs    Lab 10/29/2017 1553 11/02/17 0448  WBC 18.7* 15.8*  NEUTROABS 16.1*  --   HGB 13.8 11.4*  HCT 43.5 38.2  MCV 99.5 101.6*  PLT 188 921   Basic Metabolic Panel: Recent Labs  Lab 11/10/2017 1545 11/02/17 0448  NA 147* 149*  K 5.9* 3.8  CL 111 117*  CO2 22 19*  GLUCOSE 93 85  BUN 42* 39*  CREATININE 1.05* 1.07*  CALCIUM 8.6* 7.3*   GFR: CrCl cannot be calculated (Unknown ideal weight.). Liver Function Tests: Recent Labs  Lab 10/25/2017 1545 11/02/17 0448  AST 59* 29  ALT 25 22  ALKPHOS 117 94  BILITOT 1.9* 1.0  PROT 6.2* 5.0*  ALBUMIN 3.2* 2.5*   No results for input(s): LIPASE, AMYLASE in the last 168 hours. No results for input(s): AMMONIA in the last 168 hours. Coagulation Profile: Recent Labs  Lab 10/28/2017 1545  INR 2.62   Cardiac Enzymes: Recent Labs  Lab 10/25/2017 1545  CKTOTAL 187   BNP (last 3 results) No results for input(s): PROBNP in the last 8760 hours. HbA1C: No results for input(s): HGBA1C in the last 72 hours. CBG: Recent Labs  Lab 10/25/2017 1547  GLUCAP 99   Lipid Profile: No results for input(s): CHOL, HDL, LDLCALC, TRIG, CHOLHDL, LDLDIRECT in the last 72 hours. Thyroid Function Tests: Recent Labs    10/25/2017 2028  TSH 0.547  FREET4 0.90   Anemia Panel: No results for input(s): VITAMINB12, FOLATE, FERRITIN, TIBC, IRON, RETICCTPCT in the last 72 hours. Urine  analysis:    Component Value Date/Time   COLORURINE AMBER (A) 10/24/2017 1625   APPEARANCEUR CLEAR 10/24/2017 1625   LABSPEC 1.027 10/16/2017 1625   PHURINE 5.0 10/21/2017 1625   GLUCOSEU NEGATIVE 10/26/2017 1625   HGBUR NEGATIVE 10/23/2017 1625   BILIRUBINUR NEGATIVE 10/27/2017 1625   KETONESUR NEGATIVE 11/12/2017 1625   PROTEINUR 100 (A) 11/09/2017 1625   NITRITE NEGATIVE 11/09/2017 1625   LEUKOCYTESUR NEGATIVE 11/12/2017 1625     Ripudeep Rai M.D. [facility] 11/03/2017, 11:34 AM  Pager: [phone] Between 7am to 7pm - call Pager - [phone]  After 7pm  go to [email or web address] - password [email or web address]  Call night coverage person covering after 7pm

## 2017-11-04 DIAGNOSIS — R6251 Failure to thrive (child): Secondary | ICD-10-CM

## 2017-11-04 MED ORDER — SODIUM CHLORIDE 0.9 % IV SOLN
INTRAVENOUS | Status: DC
  Administered 2017-11-04: 10:00:00 via INTRAVENOUS

## 2017-11-04 MED ORDER — SODIUM CHLORIDE 0.9 % IV BOLUS (SEPSIS)
1000.0000 mL | Freq: Once | INTRAVENOUS | Status: AC
  Administered 2017-11-04: 1000 mL via INTRAVENOUS

## 2017-11-04 MED ORDER — MORPHINE SULFATE (PF) 2 MG/ML IV SOLN
1.0000 mg | INTRAVENOUS | Status: DC | PRN
Start: 2017-11-04 — End: 2017-11-05

## 2017-11-04 NOTE — Progress Notes (Addendum)
[facility]                                                                              Patient Demographics  Kelsey [name], is a 84 y.o. female, DOB - 19-Mar-1922, [place] date - 10/19/2017   Admitting Physician Vianne Bulls, MD  Outpatient Primary MD for the patient is Kelton Pillar, MD  Outpatient specialists:   LOS - 3  days   Medical records reviewed and are as summarized below:    Chief Complaint  Patient presents with  . Altered Mental Status       Brief summary   Kelsey [name] is a 84 y.o. female with medical history significant for atrial fibrillation on Coumadin, COPD, thyroid disease, hypertension, and depression, presented to ED with increased confusion, lethargy, food refusal, and multiple falls. Patient lives at home where she is cared for by a home health aide.  Patient had multiple falls over the past couple weeks and has been increasingly confused, refusing food or drink, and less active for the past 4-5 days.  Per caregiver, she has been groaning in apparent pain.  Patient was recently treated with Ceftin for a UTI and had completed the course.   In ED, patient was tachycardia, A. fib with RVR, BP low 90/56.  EKG showed A. fib with RVR with heart rate 141.  Chest x-ray showed left-sided pleural effusion with left lower lobe pneumonia or atelectasis.  Patient was admitted for further workup.      Assessment & Plan    Principal Problem:   Acute metabolic encephalopathy with recurrent falls, end-of-life - Per family, has been declining for the last couple of months  - CT head negative for acute intracranial abnormality -UA negative for UTI, on IV Zithromax, Rocephin - No significant improvement since admission. - Hypotensive this morning, BP in 70s, given IV fluid bolus, still BP 69/58, Cardizem drip had been discontinued earlier this morning. Shallow breathing, unresponsive. - I had discussed in detail with the daughter  yesterday, 1/22, she had requested comfort measures however continue current management "to keep her alive" until she gets back to [place] from [place]. Spoke to the patient's daughter this morning and updated in detail, poor prognosis, possibly hours to less than 24 hours. Daughter understands that patient may pass prior to her arriving to [facility]. She requested comfort care however still wants to continue fluids, IV antibiotics. Notified Elmo Putt, Palliative NP.  Active Problems:   Atrial fibrillation with RVR (HCC) rate 120-140 range in ED, history of chronic atrial fibrillation -Initially was treated with IV Lopressor in ED, no improvement. Subsequently was started on Cardizem drip -Patient was anticoagulated with Coumadin PTA, however having recent multiple falls, not a good candidate for Coumadin anymore, risk outweighs benefit at this time - Discontinued Cardizem drip this morning due to hypotension    COPD (chronic obstructive pulmonary disease) (HCC) - Continue duo nebs as needed, O2 supplementation  Community-acquired pneumonia, left-sided pleural effusion, sepsis -Patient met Sirs/sepsis criteria given leukocytosis, hypotension, tachycardia, pneumonia. Blood cultures negative so far. Per daughter's request, continue IV Zithromax, Rocephin for now.  - Comfort care  status   Hypernatremia, hyperkalemia, lactic acidosis - In the setting of dehydration, sepsis or SIRS.  - Continue IV fluid hydration for now to daughter arrives, Comfort Care status     Essential hypertension - Currently hypotensive, on fluids, Cardizem drip discontinued    History of Hyperthyroidism  - has not been taking Tapazole, TSH 0.5, free T4 0.9   Code Status: dnr  DVT Prophylaxis:   SCD's Family Communication: Please see my note earlier today, spoke to the daughter who is at [place] airport. Requested Comfort Care status however continue fluids and antibiotics until she arrives. Disposition Plan:  Time  Spent in minutes  35 minutes  Procedures:  None   Consultants:   Palliative medicine  Antimicrobials:    IV Zithromax 1/20  IV Rocephin 1/20   Medications  Scheduled Meds: .  morphine injection  2 mg Intravenous Q4H   Continuous Infusions: . azithromycin Stopped (11/03/17 2252)  . cefTRIAXone (ROCEPHIN)  IV Stopped (11/03/17 2107)  . dextrose 5 % and 0.45% NaCl 75 mL/hr at 11/04/17 0804  . diltiazem (CARDIZEM) infusion 14 mg/hr (11/04/17 0144)   PRN Meds:.albuterol, antiseptic oral rinse, glycopyrrolate, LORazepam, morphine injection, ondansetron (ZOFRAN) IV, polyvinyl alcohol   Antibiotics   Anti-infectives (From admission, onward)   Start     Dose/Rate Route Frequency Ordered Stop   10/20/2017 2015  cefTRIAXone (ROCEPHIN) 1 g in dextrose 5 % 50 mL IVPB     1 g 100 mL/hr over 30 Minutes Intravenous Every 24 hours 10/14/2017 2013 11/08/17 2014   10/23/2017 2015  azithromycin (ZITHROMAX) 500 mg in dextrose 5 % 250 mL IVPB     500 mg 250 mL/hr over 60 Minutes Intravenous Every 24 hours 10/23/2017 2013 11/08/17 2014        Subjective:   [name] was seen and examined today.  Shallow breathing, hypotensive, somewhat comfortable after aspen collar removed. Lethargic, unresponsive. Temperature 95.7 degrees   Objective:   Vitals:   11/04/17 0403 11/04/17 0411 11/04/17 0803 11/04/17 0813  BP: (!) 83/65 95/63 (!) 86/56   Pulse: (!) 117  67   Resp: (!) 25 16 15    Temp:  (!) 96.7 F (35.9 C)  (!) 95.7 F (35.4 C)  TempSrc:  Rectal  Axillary  SpO2: 99% 99% 97%   Weight:        Intake/Output Summary (Last 24 hours) at 11/04/2017 0300 Last data filed at 11/03/2017 2115 Gross per 24 hour  Intake -  Output 275 ml  Net -275 ml     Wt Readings from Last 3 Encounters:  11/03/17 71.4 kg (157 lb 6.5 oz)  10/26/13 69.4 kg (153 lb)  12/16/11 65.8 kg (145 lb)     Exam  Physical Exam  General:  Lethargic, unresponsive, shallow breathing  Eyes:   HEENT:    Cardiovascular: S1 S2 auscultated clear, irregular No pedal edema b/l  Respiratory: Decreased breath sounds with scattered rhonchi  Gastrointestinal: Soft, nontender, nondistended, + bowel sounds  Ext: no pedal edema bilaterally  Neuro: unable to assess  Musculoskeletal:   Skin: No rashes  Psych: lethargic, unresponsive   Data Reviewed:  I have personally reviewed following labs and imaging studies  Micro Results Recent Results (from the past 240 hour(s))  Blood culture (routine x 2)     Status: None (Preliminary result)   Collection Time: 10/27/2017  3:45 PM  Result Value Ref Range Status   Specimen Description BLOOD BLOOD LEFT HAND  Final   Special Requests  Final    BOTTLES DRAWN AEROBIC AND ANAEROBIC Blood Culture adequate volume   Culture NO GROWTH 3 DAYS  Final   Report Status PENDING  Incomplete  Urine culture     Status: None   Collection Time: 10/29/2017  4:25 PM  Result Value Ref Range Status   Specimen Description URINE, RANDOM  Final   Special Requests NONE  Final   Culture NO GROWTH  Final   Report Status 11/02/2017 FINAL  Final  Blood culture (routine x 2)     Status: None (Preliminary result)   Collection Time: 11/06/2017  6:25 PM  Result Value Ref Range Status   Specimen Description BLOOD BLOOD RIGHT HAND  Final   Special Requests IN PEDIATRIC BOTTLE Blood Culture adequate volume  Final   Culture NO GROWTH 3 DAYS  Final   Report Status PENDING  Incomplete    Radiology Reports Ct Abdomen Pelvis Wo Contrast  Result Date: 10/21/2017 CLINICAL DATA:  Altered mental status, falls, uncooperative patient. EXAM: CT CHEST, ABDOMEN AND PELVIS WITHOUT CONTRAST TECHNIQUE: Multidetector CT imaging of the chest, abdomen and pelvis was performed following the standard protocol without IV contrast. COMPARISON:  Chest radiograph 10/24/2017, 10/17/2017, head 120 19 FINDINGS: CT CHEST FINDINGS Cardiovascular: Coronary artery calcification and aortic atherosclerotic  calcification. No pericardial effusion. Mediastinum/Nodes: No axillary or supraclavicular adenopathy. No mediastinal hilar adenopathy. Lungs/Pleura: Bilateral moderate pleural effusions. There is linear bands of atelectasis and potential loculated effusion within the lingula. No clear mass lesion. Ground-glass nodule in the RIGHT lower lobe measures 11 mm (image 23, series 3 Musculoskeletal: No evidence of rib fracture. Osteopenia. Severe degenerate changes shoulders. Chronic appearing compression fracture at T2 and T5 CT ABDOMEN AND PELVIS FINDINGS Hepatobiliary: 2 low-density lesions in the liver cannot be characterized without IV contrast. Central RIGHT hepatic lobe measures 12 mm and and LEFT lateral hepatic lobe measuring 9 mm both on image 50, series 3. Gallbladder normal. Pancreas: Pancreas is normal. No ductal dilatation. No pancreatic inflammation. Spleen: Normal spleen Adrenals/urinary tract: Adrenal glands and kidneys are normal. The ureters and bladder normal. Stomach/Bowel: The entire stomach is contained within a large hiatal hernia which extends into the posterior mediastinum behind the heart and into the LEFT hemithorax. The stomach is inverted such that the cardia is below the antrum (organoaxial volvulus). No evidence of high-grade obstruction. Duodenum small bowel are normal. Colon rectosigmoid colon normal. Vascular/Lymphatic: Abdominal aorta is normal caliber. There is no retroperitoneal or periportal lymphadenopathy. No pelvic lymphadenopathy. Reproductive: Post hysterectomy Other: No free fluid. Musculoskeletal: LEFT hip prosthetic. Severe osteopenia of the bones. Bulky osteophytosis IMPRESSION: Chest Impression: 1. Bilateral pleural effusions. 2. Atelectasis and loculated effusion in the LEFT lung in part related to the large hiatal hernia. No acute findings or malignancy identified. 3. Ground-glass nodule in the RIGHT lower lobe may represent small focus of infection. Low-grade neoplasm  cannot be excluded. 4. Chronic compression fracture the upper thoracic spine. Abdomen / Pelvis Impression: 1. Large hiatal hernia with the entire stomach above the diaphragm within a large hernia sac . 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 2. No acute findings in the abdomen pelvis. 3. Two hypodense lesions in liver cannot be characterized. 4. Severe degenerative arthropathy of the lumbar spine. Electronically Signed   By: Suzy Bouchard M.D.   On: 11/06/2017 18:20   Dg Ribs Unilateral W/chest Left  Result Date: 10/24/2017 CLINICAL DATA:  Fall, left lateral chest pain EXAM:  LEFT RIBS AND CHEST - 3+ VIEW COMPARISON:  11/25/2006 FINDINGS: Cardiomegaly. Left lower lobe atelectasis. No visible left rib fracture. No effusion or pneumothorax. IMPRESSION: Cardiomegaly.  Left lower lobe atelectasis. No visible rib fracture. Electronically Signed   By: Rolm Baptise M.D.   On: 10/24/2017 10:49   Dg Tibia/fibula Left  Result Date: 10/23/2017 CLINICAL DATA:  84 year old female status post multiple falls EXAM: LEFT TIBIA AND FIBULA - 2 VIEW COMPARISON:  Concurrently obtained radiographs of the right tib-fib FINDINGS: Surgical changes of prior total knee arthroplasty without evidence of hardware complication or periprosthetic fracture. No acute fracture or malalignment identified. The bones appear osteopenic. IMPRESSION: Negative. Electronically Signed   By: Jacqulynn Cadet M.D.   On: 10/25/2017 18:30   Dg Tibia/fibula Right  Result Date: 11/02/2017 CLINICAL DATA:  84 year old female status post fall EXAM: RIGHT TIBIA AND FIBULA - 2 VIEW COMPARISON:  Concurrently obtained radiographs of the left tib-fib FINDINGS: Surgical changes of prior total knee arthroplasty without evidence of hardware complication. No acute fracture or malalignment. The bones appear osteopenic. Soft tissues are unremarkable. IMPRESSION: Negative. Electronically  Signed   By: Jacqulynn Cadet M.D.   On: 10/31/2017 18:30   Ct Head Wo Contrast  Result Date: 10/13/2017 CLINICAL DATA:  Altered mental status.  Multiple falls. EXAM: CT HEAD WITHOUT CONTRAST CT CERVICAL SPINE WITHOUT CONTRAST TECHNIQUE: Multidetector CT imaging of the head and cervical spine was performed following the standard protocol without intravenous contrast. Multiplanar CT image reconstructions of the cervical spine were also generated. COMPARISON:  None. FINDINGS: CT HEAD FINDINGS Brain: No evidence of acute infarction, hemorrhage, hydrocephalus, extra-axial collection or mass lesion/mass effect. Marked brain parenchymal atrophy and white matter microangiopathy. Lacunar infarct in bilateral basal ganglia. Vascular: Calcific atherosclerotic disease at the skull base. Skull: Normal. Negative for fracture or focal lesion. Sinuses/Orbits: No acute finding. Other: Left frontal scalp hematoma. CT CERVICAL SPINE FINDINGS Alignment: 3 mm posterior listhesis of T1 vertebral body. Skull base and vertebrae: Compression deformity of T1 vertebral body, which is poorly visualized due to motion artifact, however suspicious for acute fracture. Soft tissues and spinal canal: No prevertebral fluid or swelling. No visible canal hematoma. Disc levels: Advanced multilevel osteoarthritic changes of the cervical spine. Upper chest: Large left pleural effusion. Other: None. IMPRESSION: No acute intracranial abnormality. Advanced brain parenchymal atrophy, chronic microvascular disease, chronic bilateral basal ganglia lacunar infarcts. Suspected acute fracture of T1 vertebral body, poorly visualized due to motion artifact, with approximate 30% height loss. 3 mm posterior listhesis of T1 on C7. Multilevel osteoarthritic changes of the cervical spine. Large left pleural effusion. These results were called by telephone at the time of interpretation on 10/17/2017 at 6:22 pm to Dr. Charlesetta Shanks , who verbally acknowledged these  results. Electronically Signed   By: Fidela Salisbury M.D.   On: 11/04/2017 18:23   Ct Head Wo Contrast  Result Date: 10/24/2017 CLINICAL DATA:  Bilateral periorbital bruising following a fall last night and 3 nights ago. On Coumadin. EXAM: CT HEAD WITHOUT CONTRAST CT MAXILLOFACIAL WITHOUT CONTRAST CT CERVICAL SPINE WITHOUT CONTRAST TECHNIQUE: Multidetector CT imaging of the head, cervical spine, and maxillofacial structures were performed using the standard protocol without intravenous contrast. Multiplanar CT image reconstructions of the cervical spine and maxillofacial structures were also generated. COMPARISON:  None. FINDINGS: CT HEAD FINDINGS Brain: Diffusely enlarged ventricles and subarachnoid spaces. Patchy white matter low density in both cerebral hemispheres. Old bilateral basal ganglia lacunar infarcts. No intracranial hemorrhage, mass lesion or CT evidence  of acute infarction. Vascular: No hyperdense vessel or unexpected calcification. Skull: Mildly limited due to motion degradation. Normal. Negative for fracture or focal lesion. Other: Midline and left frontal scalp hematoma. CT MAXILLOFACIAL FINDINGS Osseous: Nondisplaced fracture of the distal tip of the nasal bone. Mild motion degradation with no additional fractures visualized. The anterior maxillary spine is intact. Orbits: Status post bilateral cataract extraction. Otherwise, normal appearing orbital contents. Sinuses: Normally pneumatized. Soft tissues: Bilateral periorbital soft tissue hematomas. CT CERVICAL SPINE FINDINGS Alignment: Grade 1 anterolisthesis at the C7-T1 level. Skull base and vertebrae: Mildly limited due to motion degradation. No acute fracture. No primary bone lesion or focal pathologic process. Soft tissues and spinal canal: No prevertebral fluid or swelling. No visible canal hematoma. Disc levels: Multilevel degenerative changes, including facet degenerative changes at multiple levels. Upper chest: Mild biapical  pleural and parenchymal scarring. Other: Enlarged right lobe of the thyroid gland containing multiple nodules measuring up to 2.1 cm in maximum diameter each. Surgically absent left lobe of the thyroid gland. IMPRESSION: 1. The examinations are mildly limited by patient motion. 2. No skull fracture or intracranial hemorrhage. 3. Nondisplaced fracture of the tip of the nasal bone. 4. No cervical spine fracture or traumatic subluxation. 5. Bilateral periorbital soft tissue hematomas and midline and left frontal scalp hematomas. 6. Mild to moderate diffuse cerebral and cerebellar atrophy. 7. Extensive chronic small vessel white matter ischemic changes in both cerebral hemispheres. 8. Old bilateral basal ganglia lacunar infarcts. 9. Multilevel cervical spine degenerative changes with associated grade 1 anterolisthesis at the C7-T1 level. 10. Multinodular thyroid goiter on the right with the largest nodule measuring 2.1 cm. Recommend further evaluation with thyroid ultrasound. If patient is clinically hyperthyroid, consider nuclear medicine thyroid uptake and scan. Electronically Signed   By: Claudie Revering M.D.   On: 10/24/2017 11:38   Ct Chest Wo Contrast  Result Date: 10/13/2017 CLINICAL DATA:  Altered mental status, falls, uncooperative patient. EXAM: CT CHEST, ABDOMEN AND PELVIS WITHOUT CONTRAST TECHNIQUE: Multidetector CT imaging of the chest, abdomen and pelvis was performed following the standard protocol without IV contrast. COMPARISON:  Chest radiograph 10/24/2017, 10/30/2017, head 120 19 FINDINGS: CT CHEST FINDINGS Cardiovascular: Coronary artery calcification and aortic atherosclerotic calcification. No pericardial effusion. Mediastinum/Nodes: No axillary or supraclavicular adenopathy. No mediastinal hilar adenopathy. Lungs/Pleura: Bilateral moderate pleural effusions. There is linear bands of atelectasis and potential loculated effusion within the lingula. No clear mass lesion. Ground-glass nodule in the  RIGHT lower lobe measures 11 mm (image 23, series 3 Musculoskeletal: No evidence of rib fracture. Osteopenia. Severe degenerate changes shoulders. Chronic appearing compression fracture at T2 and T5 CT ABDOMEN AND PELVIS FINDINGS Hepatobiliary: 2 low-density lesions in the liver cannot be characterized without IV contrast. Central RIGHT hepatic lobe measures 12 mm and and LEFT lateral hepatic lobe measuring 9 mm both on image 50, series 3. Gallbladder normal. Pancreas: Pancreas is normal. No ductal dilatation. No pancreatic inflammation. Spleen: Normal spleen Adrenals/urinary tract: Adrenal glands and kidneys are normal. The ureters and bladder normal. Stomach/Bowel: The entire stomach is contained within a large hiatal hernia which extends into the posterior mediastinum behind the heart and into the LEFT hemithorax. The stomach is inverted such that the cardia is below the antrum (organoaxial volvulus). No evidence of high-grade obstruction. Duodenum small bowel are normal. Colon rectosigmoid colon normal. Vascular/Lymphatic: Abdominal aorta is normal caliber. There is no retroperitoneal or periportal lymphadenopathy. No pelvic lymphadenopathy. Reproductive: Post hysterectomy Other: No free fluid. Musculoskeletal: LEFT hip prosthetic. Severe osteopenia  of the bones. Bulky osteophytosis IMPRESSION: Chest Impression: 1. Bilateral pleural effusions. 2. Atelectasis and loculated effusion in the LEFT lung in part related to the large hiatal hernia. No acute findings or malignancy identified. 3. Ground-glass nodule in the RIGHT lower lobe may represent small focus of infection. Low-grade neoplasm cannot be excluded. 4. Chronic compression fracture the upper thoracic spine. Abdomen / Pelvis Impression: 1. Large hiatal hernia with the entire stomach above the diaphragm within a large hernia sac . 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 2. No  acute findings in the abdomen pelvis. 3. Two hypodense lesions in liver cannot be characterized. 4. Severe degenerative arthropathy of the lumbar spine. Electronically Signed   By: Suzy Bouchard M.D.   On: 10/31/2017 18:20   Ct Cervical Spine Wo Contrast  Result Date: 10/20/2017 CLINICAL DATA:  Altered mental status.  Multiple falls. EXAM: CT HEAD WITHOUT CONTRAST CT CERVICAL SPINE WITHOUT CONTRAST TECHNIQUE: Multidetector CT imaging of the head and cervical spine was performed following the standard protocol without intravenous contrast. Multiplanar CT image reconstructions of the cervical spine were also generated. COMPARISON:  None. FINDINGS: CT HEAD FINDINGS Brain: No evidence of acute infarction, hemorrhage, hydrocephalus, extra-axial collection or mass lesion/mass effect. Marked brain parenchymal atrophy and white matter microangiopathy. Lacunar infarct in bilateral basal ganglia. Vascular: Calcific atherosclerotic disease at the skull base. Skull: Normal. Negative for fracture or focal lesion. Sinuses/Orbits: No acute finding. Other: Left frontal scalp hematoma. CT CERVICAL SPINE FINDINGS Alignment: 3 mm posterior listhesis of T1 vertebral body. Skull base and vertebrae: Compression deformity of T1 vertebral body, which is poorly visualized due to motion artifact, however suspicious for acute fracture. Soft tissues and spinal canal: No prevertebral fluid or swelling. No visible canal hematoma. Disc levels: Advanced multilevel osteoarthritic changes of the cervical spine. Upper chest: Large left pleural effusion. Other: None. IMPRESSION: No acute intracranial abnormality. Advanced brain parenchymal atrophy, chronic microvascular disease, chronic bilateral basal ganglia lacunar infarcts. Suspected acute fracture of T1 vertebral body, poorly visualized due to motion artifact, with approximate 30% height loss. 3 mm posterior listhesis of T1 on C7. Multilevel osteoarthritic changes of the cervical spine.  Large left pleural effusion. These results were called by telephone at the time of interpretation on 11/11/2017 at 6:22 pm to Dr. Charlesetta Shanks , who verbally acknowledged these results. Electronically Signed   By: Fidela Salisbury M.D.   On: 10/31/2017 18:23   Ct Cervical Spine Wo Contrast  Result Date: 10/24/2017 CLINICAL DATA:  Bilateral periorbital bruising following a fall last night and 3 nights ago. On Coumadin. EXAM: CT HEAD WITHOUT CONTRAST CT MAXILLOFACIAL WITHOUT CONTRAST CT CERVICAL SPINE WITHOUT CONTRAST TECHNIQUE: Multidetector CT imaging of the head, cervical spine, and maxillofacial structures were performed using the standard protocol without intravenous contrast. Multiplanar CT image reconstructions of the cervical spine and maxillofacial structures were also generated. COMPARISON:  None. FINDINGS: CT HEAD FINDINGS Brain: Diffusely enlarged ventricles and subarachnoid spaces. Patchy white matter low density in both cerebral hemispheres. Old bilateral basal ganglia lacunar infarcts. No intracranial hemorrhage, mass lesion or CT evidence of acute infarction. Vascular: No hyperdense vessel or unexpected calcification. Skull: Mildly limited due to motion degradation. Normal. Negative for fracture or focal lesion. Other: Midline and left frontal scalp hematoma. CT MAXILLOFACIAL FINDINGS Osseous: Nondisplaced fracture of the distal tip of the nasal bone. Mild motion degradation with no additional fractures visualized. The anterior maxillary spine is intact. Orbits: Status  post bilateral cataract extraction. Otherwise, normal appearing orbital contents. Sinuses: Normally pneumatized. Soft tissues: Bilateral periorbital soft tissue hematomas. CT CERVICAL SPINE FINDINGS Alignment: Grade 1 anterolisthesis at the C7-T1 level. Skull base and vertebrae: Mildly limited due to motion degradation. No acute fracture. No primary bone lesion or focal pathologic process. Soft tissues and spinal canal: No  prevertebral fluid or swelling. No visible canal hematoma. Disc levels: Multilevel degenerative changes, including facet degenerative changes at multiple levels. Upper chest: Mild biapical pleural and parenchymal scarring. Other: Enlarged right lobe of the thyroid gland containing multiple nodules measuring up to 2.1 cm in maximum diameter each. Surgically absent left lobe of the thyroid gland. IMPRESSION: 1. The examinations are mildly limited by patient motion. 2. No skull fracture or intracranial hemorrhage. 3. Nondisplaced fracture of the tip of the nasal bone. 4. No cervical spine fracture or traumatic subluxation. 5. Bilateral periorbital soft tissue hematomas and midline and left frontal scalp hematomas. 6. Mild to moderate diffuse cerebral and cerebellar atrophy. 7. Extensive chronic small vessel white matter ischemic changes in both cerebral hemispheres. 8. Old bilateral basal ganglia lacunar infarcts. 9. Multilevel cervical spine degenerative changes with associated grade 1 anterolisthesis at the C7-T1 level. 10. Multinodular thyroid goiter on the right with the largest nodule measuring 2.1 cm. Recommend further evaluation with thyroid ultrasound. If patient is clinically hyperthyroid, consider nuclear medicine thyroid uptake and scan. Electronically Signed   By: Claudie Revering M.D.   On: 10/24/2017 11:38   Dg Chest Portable 1 View  Result Date: 10/20/2017 CLINICAL DATA:  Increased altered mental status. EXAM: PORTABLE CHEST 1 VIEW COMPARISON:  10/24/2017 FINDINGS: Enlarged cardiac silhouette. Calcific atherosclerotic disease of the aorta. Mediastinal contours appear intact. There is no evidence of focal pneumothorax. Interval development of left pleural effusion and left lower lobe airspace consolidation versus atelectasis. Osseous structures are without acute abnormality. Soft tissues are grossly normal. IMPRESSION: Interval development of left pleural effusion and left lower lobe atelectasis versus  airspace consolidation. Enlarged cardiac silhouette. Calcific atherosclerotic disease of the aorta. Electronically Signed   By: Fidela Salisbury M.D.   On: 11/08/2017 16:21   Ct Maxillofacial Wo Contrast  Result Date: 10/24/2017 CLINICAL DATA:  Bilateral periorbital bruising following a fall last night and 3 nights ago. On Coumadin. EXAM: CT HEAD WITHOUT CONTRAST CT MAXILLOFACIAL WITHOUT CONTRAST CT CERVICAL SPINE WITHOUT CONTRAST TECHNIQUE: Multidetector CT imaging of the head, cervical spine, and maxillofacial structures were performed using the standard protocol without intravenous contrast. Multiplanar CT image reconstructions of the cervical spine and maxillofacial structures were also generated. COMPARISON:  None. FINDINGS: CT HEAD FINDINGS Brain: Diffusely enlarged ventricles and subarachnoid spaces. Patchy white matter low density in both cerebral hemispheres. Old bilateral basal ganglia lacunar infarcts. No intracranial hemorrhage, mass lesion or CT evidence of acute infarction. Vascular: No hyperdense vessel or unexpected calcification. Skull: Mildly limited due to motion degradation. Normal. Negative for fracture or focal lesion. Other: Midline and left frontal scalp hematoma. CT MAXILLOFACIAL FINDINGS Osseous: Nondisplaced fracture of the distal tip of the nasal bone. Mild motion degradation with no additional fractures visualized. The anterior maxillary spine is intact. Orbits: Status post bilateral cataract extraction. Otherwise, normal appearing orbital contents. Sinuses: Normally pneumatized. Soft tissues: Bilateral periorbital soft tissue hematomas. CT CERVICAL SPINE FINDINGS Alignment: Grade 1 anterolisthesis at the C7-T1 level. Skull base and vertebrae: Mildly limited due to motion degradation. No acute fracture. No primary bone lesion or focal pathologic process. Soft tissues and spinal canal: No prevertebral fluid or swelling.  No visible canal hematoma. Disc levels: Multilevel  degenerative changes, including facet degenerative changes at multiple levels. Upper chest: Mild biapical pleural and parenchymal scarring. Other: Enlarged right lobe of the thyroid gland containing multiple nodules measuring up to 2.1 cm in maximum diameter each. Surgically absent left lobe of the thyroid gland. IMPRESSION: 1. The examinations are mildly limited by patient motion. 2. No skull fracture or intracranial hemorrhage. 3. Nondisplaced fracture of the tip of the nasal bone. 4. No cervical spine fracture or traumatic subluxation. 5. Bilateral periorbital soft tissue hematomas and midline and left frontal scalp hematomas. 6. Mild to moderate diffuse cerebral and cerebellar atrophy. 7. Extensive chronic small vessel white matter ischemic changes in both cerebral hemispheres. 8. Old bilateral basal ganglia lacunar infarcts. 9. Multilevel cervical spine degenerative changes with associated grade 1 anterolisthesis at the C7-T1 level. 10. Multinodular thyroid goiter on the right with the largest nodule measuring 2.1 cm. Recommend further evaluation with thyroid ultrasound. If patient is clinically hyperthyroid, consider nuclear medicine thyroid uptake and scan. Electronically Signed   By: Claudie Revering M.D.   On: 10/24/2017 11:38    Lab Data:  CBC: Recent Labs  Lab 10/25/2017 1553 11/02/17 0448  WBC 18.7* 15.8*  NEUTROABS 16.1*  --   HGB 13.8 11.4*  HCT 43.5 38.2  MCV 99.5 101.6*  PLT 188 539   Basic Metabolic Panel: Recent Labs  Lab 10/14/2017 1545 11/02/17 0448  NA 147* 149*  K 5.9* 3.8  CL 111 117*  CO2 22 19*  GLUCOSE 93 85  BUN 42* 39*  CREATININE 1.05* 1.07*  CALCIUM 8.6* 7.3*   GFR: CrCl cannot be calculated (Unknown ideal weight.). Liver Function Tests: Recent Labs  Lab 10/17/2017 1545 11/02/17 0448  AST 59* 29  ALT 25 22  ALKPHOS 117 94  BILITOT 1.9* 1.0  PROT 6.2* 5.0*  ALBUMIN 3.2* 2.5*   No results for input(s): LIPASE, AMYLASE in the last 168 hours. No results  for input(s): AMMONIA in the last 168 hours. Coagulation Profile: Recent Labs  Lab 11/08/2017 1545  INR 2.62   Cardiac Enzymes: Recent Labs  Lab 10/28/2017 1545  CKTOTAL 187   BNP (last 3 results) No results for input(s): PROBNP in the last 8760 hours. HbA1C: No results for input(s): HGBA1C in the last 72 hours. CBG: Recent Labs  Lab 10/30/2017 1547  GLUCAP 99   Lipid Profile: No results for input(s): CHOL, HDL, LDLCALC, TRIG, CHOLHDL, LDLDIRECT in the last 72 hours. Thyroid Function Tests: Recent Labs    11/03/2017 2028  TSH 0.547  FREET4 0.90   Anemia Panel: No results for input(s): VITAMINB12, FOLATE, FERRITIN, TIBC, IRON, RETICCTPCT in the last 72 hours. Urine analysis:    Component Value Date/Time   COLORURINE AMBER (A) 11/02/2017 1625   APPEARANCEUR CLEAR 11/09/2017 1625   LABSPEC 1.027 10/15/2017 1625   PHURINE 5.0 10/18/2017 1625   GLUCOSEU NEGATIVE 10/16/2017 1625   HGBUR NEGATIVE 10/25/2017 1625   BILIRUBINUR NEGATIVE 10/18/2017 1625   KETONESUR NEGATIVE 10/15/2017 1625   PROTEINUR 100 (A) 10/14/2017 1625   NITRITE NEGATIVE 10/31/2017 1625   LEUKOCYTESUR NEGATIVE 11/04/2017 1625     Allysha Tryon M.D. [facility] 11/04/2017, 9:14 AM  Pager: [phone] Between 7am to 7pm - call Pager - [phone]  After 7pm go to [email or web address] - password [email or web address]  Call night coverage person covering after 7pm

## 2017-11-04 NOTE — Progress Notes (Signed)
Daily Progress Note   Patient Name: Kelsey [name]       Date: 11/04/2017 DOB: [birth year]  Age: 84 y.o. MRN#: [id number] Attending Physician: Mendel Corning, MD Primary Care Physician: Kelton Pillar, MD Admit Date: 10/19/2017  Reason for Consultation/Follow-up: Establishing goals of care and Terminal Care  Subjective: Kelsey [name] is unresponsive and progressing at EOL.   Length of Stay: 3  Current Medications: Scheduled Meds:  .  morphine injection  2 mg Intravenous Q4H    Continuous Infusions: . sodium chloride 25 mL/hr (11/04/17 1509)  . azithromycin Stopped (11/03/17 2252)  . cefTRIAXone (ROCEPHIN)  IV Stopped (11/03/17 2107)    PRN Meds: albuterol, antiseptic oral rinse, glycopyrrolate, LORazepam, morphine injection, ondansetron (ZOFRAN) IV, polyvinyl alcohol  Physical Exam         Constitutional: She appears well-developed. She appears toxic. She has a sickly appearance.  Elderly, thin, frail Bruised all over  HENT:  Head: Normocephalic and atraumatic.  Cardiovascular: An irregularly irregular rhythm present. Tachycardia present.  Pulmonary/Chest: No accessory muscle usage. No tachypnea. No respiratory distress. She has decreased breath sounds. Agonal breathing. Apneic episodes present.  Abdominal: Soft. Normal appearance.  Neurological: She is disoriented.  Unresponsive  Nursing note and vitals reviewed.    Vital Signs: BP (!) 78/59   Pulse 69   Temp (!) 95.7 F (35.4 C) (Axillary)   Resp 19   Wt 71.4 kg (157 lb 6.5 oz)   SpO2 98%   BMI 28.79 kg/m  SpO2: SpO2: 98 % O2 Device: O2 Device: Nasal Cannula O2 Flow Rate: O2 Flow Rate (L/min): 3 L/min  Intake/output summary:   Intake/Output Summary (Last 24 hours) at 11/04/2017 1557 Last data filed at  11/03/2017 2115 Gross per 24 hour  Intake -  Output 275 ml  Net -275 ml   LBM: Last BM Date: (UTA) Baseline Weight: Weight: 71.4 kg (157 lb 6.5 oz) Most recent weight: Weight: 71.4 kg (157 lb 6.5 oz)       Palliative Assessment/Data: 10%    Flowsheet Rows     Most Recent Value  Intake Tab  Referral Department  -- [ED]  Unit at Time of Referral  ER  Palliative Care Primary Diagnosis  Cardiac  Date Notified  11/02/17  Palliative Care Type  New Palliative care  Reason for  referral  Clarify Goals of Care  Date of Admission  10/19/2017  Date first seen by Palliative Care  11/03/17  # of days Palliative referral response time  1 Day(s)  # of days IP prior to Palliative referral  1  Clinical Assessment  Psychosocial & Spiritual Assessment  Palliative Care Outcomes      Patient Active Problem List   Diagnosis Date Noted  . Goals of care, counseling/discussion   . Palliative care encounter   . Pleural effusion 11/08/2017  . Multiple falls 11/09/2017  . Hypernatremia 10/26/2017  . Hyperkalemia 10/30/2017  . Acute encephalopathy 10/22/2017  . Community acquired pneumonia 10/22/2017  . Hyperthyroidism 10/20/2017  . Atrial fibrillation with RVR ([place]) 10/26/2013  . Secondary pulmonary hypertension 10/26/2013  . COPD (chronic obstructive pulmonary disease) ([place]) 10/26/2013  . Essential hypertension 10/26/2013  . Hyperlipidemia 10/26/2013  . Chronic diastolic heart failure ([place]) 10/26/2013    Palliative Care Assessment & Plan   HPI: 84 y.o. female  with past medical history of atrial fibrillation on Coumadin, COPD, thyroid disease, hypertension, RLS and depression with functional decline over past few months admitted on 10/16/2017 with multiple recent falls, not eating, lethargy and confusion and found to have AF RVR, CAP with pleural effusion. Patient continues to decline and moan in pain. Palliative requested to assist with GOC and symptoms.   Assessment: Went to Ms.  [name] bedside this morning. She is progressing at EOL and is having a couple episodes of apnea and agonal breathing. Adjusting medications for EOL as needed.   Late entry: Called to bedside as family has arrived. I met daughter, Rodena Piety, and her husband as well as a church member at bedside. Rodena Piety understands that Kelsey [name] continues to progress towards EOL. She is notifying family. She is tearful as she shares that she feels guilty for leaving but I explain that her being home would not have preventing this from happening. Reassured her that she is here now and she has taken good care of her mother. Rodena Piety made it clear "this is not my mother" and explains that she is typically very feisty and "not afraid to tell you exactly what she thinks." Rodena Piety made it clear that she did not want her mother to be prolonged in this state if she is not expected to improve. We discussed d/c IVF, antibiotics, telemetry and oxygen. Emotional support provided.   Recommendations/Plan:  Pain/SOB: Morphine IV 2 mg every 4 hours scheduled. Morphine IV 1-2 mg every 30 min prn. May increase dose/frequency as needed to achieve comfort.   Agitation/anxiety: Ativan IV 0.5-1 mg every 4 hours prn.   Robinul prn secretions.   D/C aspen collar for comfort.   Goals of Care and Additional Recommendations:  Limitations on Scope of Treatment: Full Comfort Care  Code Status:  DNR  Prognosis:   Hours - Days  Discharge Planning:  Anticipated Hospital Death  Thank you for allowing the Palliative Medicine Team to assist in the care of this patient.   Total Time 45 min Prolonged Time Billed  no       Greater than 50%  of this time was spent counseling and coordinating care related to the above assessment and plan.  Vinie Sill, NP Palliative Medicine Team Pager # [phone] (M-F 8a-5p) Team Phone # [phone] (Nights/Weekends)

## 2017-11-04 NOTE — Plan of Care (Signed)
Spoke with patient's [place], [name]. Updated her in detail, patient is not doing well, hypotensive 69/58 despite fluid bolus. Cardizem drip had been stopped around 8:25am. Shallow breathing, unresponsive. Daughter had requested yesterday for comfort measures but cont current management to 'keep her alive' till she gets back to [place].  Daughter now wishes complete comfort care, overall poor prognosis, possibly hours to <24hrs. She still wants to keep on fluids and IV antibiotics till she gets to [place]. Palliative med, Yong ChannelAlicia Parker, NP notified.   Full note to follow  Cathrine Krizan M.D. [facility] 11/04/2017, 9:27 AM  Pager: [phone]

## 2017-11-04 NOTE — Progress Notes (Signed)
Pt temp 96.7 rectal. Turned up temp and provided warm blankets for comfort. Will continue to follow.

## 2017-11-06 LAB — CULTURE, BLOOD (ROUTINE X 2)
CULTURE: NO GROWTH
Culture: NO GROWTH
SPECIAL REQUESTS: ADEQUATE
SPECIAL REQUESTS: ADEQUATE

## 2017-11-13 NOTE — Death Summary Note (Signed)
DEATH SUMMARY   Patient Details  Name: Kelsey [name] MRN: [id number] DOB: [birth year]  Admission/Discharge Information   Admit Date:  11-07-17  Date of Death: Date of Death: 2017/11/11  Time of Death: Time of Death: 0152  Length of Stay: 4  Referring Physician: Kelton Pillar, MD   Reason(s) for Hospitalization  increased confusion, lethargy, food refusal and multiple falls.    Diagnoses  Preliminary cause of death: Acute metabolic encephalopathy, sepsis  Secondary Diagnoses (including complications and co-morbidities):     Acute encephalopathy   Atrial fibrillation with RVR (HCC)   COPD (chronic obstructive pulmonary disease) (HCC)   Essential hypertension   Chronic diastolic heart failure (HCC)   Pleural effusion   Multiple falls   Hypernatremia   Hyperkalemia   Community acquired pneumonia   Hyperthyroidism   Failure to thrive    Brief Hospital Course (including significant findings, care, treatment, and services provided and events leading to death)   Kelsey [name] a 84 y.o.femalewith medical history significant foratrial fibrillation on Coumadin, COPD, thyroid disease, hypertension, and depression, presented to ED with increased confusion, lethargy, food refusal, and multiple falls. Patient lives at home where she is cared for by a home health aide.  Patient had multiple falls over the past couple weeks and has been increasingly confused, refusing food or drink, and less active for the past 4-5 days.  Per caregiver, she had been groaning in apparent pain. Patient was recently treated with Ceftin for a UTI and had completed the course.   In ED, patient was tachycardia, A. fib with RVR, BP low 90/56.  EKG showed A. fib with RVR with heart rate 141.  Chest x-ray showed left-sided pleural effusion with left lower lobe pneumonia or atelectasis.  Patient was admitted for further workup.   Acute metabolic encephalopathy with recurrent falls, end-of-life - Per  family, she had been declining over the last few months.   CT head was negative for acute intracranial abnormality. UA negative for UTI, patient was placed on IV Zithromax, Rocephin Patient had no significant improvement during hospitalization,.  She met sepsis criteria due to tachycardia, hypotension, pneumonia left lower lobe. - Palliative care was consulted and I spoke with patient's daughter in detail multiple times who requested comfort measures.  Patient was placed on comfort care goals    Atrial fibrillation with RVR (HCC) rate 120-140 range in ED, history of chronic atrial fibrillation -Initially was treated with IV Lopressor in ED, no improvement. Subsequently was started on Cardizem drip -Patient was anticoagulated with Coumadin PTA, however having recent multiple falls, patient was deemed as not a good candidate for Coumadin anymore, risk outweighs benefit at this time - Cardizem drip was discontinued due to hypotension, subsequently placed on comfort care status    COPD (chronic obstructive pulmonary disease) ([place]) - Continue duo nebs as needed, O2 supplementation  Community-acquired pneumonia, left-sided pleural effusion, sepsis -Patient met Sirs/sepsis criteria given leukocytosis, hypotension, tachycardia, pneumonia. Blood cultures remained negative so far.   Hypernatremia, hyperkalemia, lactic acidosis - In the setting of dehydration, sepsis or SIRS.  - Continue IV fluid hydration for now to daughter arrives, Comfort Care status     Essential hypertension - Currently hypotensive, on fluids, Cardizem drip was discontinued    History of Hyperthyroidism  - had not been taking Tapazole, TSH 0.5, free T4 0.9   Failure to thrive, end of life Palliative care consult was obtained on the day of admission.  Patient's family requested comfort care goals.  Patient passed on 2017-11-19.    Pertinent Labs and Studies  Significant Diagnostic Studies Ct Abdomen Pelvis Wo  Contrast  Result Date: 10/28/2017 CLINICAL DATA:  Altered mental status, falls, uncooperative patient. EXAM: CT CHEST, ABDOMEN AND PELVIS WITHOUT CONTRAST TECHNIQUE: Multidetector CT imaging of the chest, abdomen and pelvis was performed following the standard protocol without IV contrast. COMPARISON:  Chest radiograph 10/24/2017, 10/31/2017, head 120 19 FINDINGS: CT CHEST FINDINGS Cardiovascular: Coronary artery calcification and aortic atherosclerotic calcification. No pericardial effusion. Mediastinum/Nodes: No axillary or supraclavicular adenopathy. No mediastinal hilar adenopathy. Lungs/Pleura: Bilateral moderate pleural effusions. There is linear bands of atelectasis and potential loculated effusion within the lingula. No clear mass lesion. Ground-glass nodule in the RIGHT lower lobe measures 11 mm (image 23, series 3 Musculoskeletal: No evidence of rib fracture. Osteopenia. Severe degenerate changes shoulders. Chronic appearing compression fracture at T2 and T5 CT ABDOMEN AND PELVIS FINDINGS Hepatobiliary: 2 low-density lesions in the liver cannot be characterized without IV contrast. Central RIGHT hepatic lobe measures 12 mm and and LEFT lateral hepatic lobe measuring 9 mm both on image 50, series 3. Gallbladder normal. Pancreas: Pancreas is normal. No ductal dilatation. No pancreatic inflammation. Spleen: Normal spleen Adrenals/urinary tract: Adrenal glands and kidneys are normal. The ureters and bladder normal. Stomach/Bowel: The entire stomach is contained within a large hiatal hernia which extends into the posterior mediastinum behind the heart and into the LEFT hemithorax. The stomach is inverted such that the cardia is below the antrum (organoaxial volvulus). No evidence of high-grade obstruction. Duodenum small bowel are normal. Colon rectosigmoid colon normal. Vascular/Lymphatic: Abdominal aorta is normal caliber. There is no retroperitoneal or periportal lymphadenopathy. No pelvic lymphadenopathy.  Reproductive: Post hysterectomy Other: No free fluid. Musculoskeletal: LEFT hip prosthetic. Severe osteopenia of the bones. Bulky osteophytosis IMPRESSION: Chest Impression: 1. Bilateral pleural effusions. 2. Atelectasis and loculated effusion in the LEFT lung in part related to the large hiatal hernia. No acute findings or malignancy identified. 3. Ground-glass nodule in the RIGHT lower lobe may represent small focus of infection. Low-grade neoplasm cannot be excluded. 4. Chronic compression fracture the upper thoracic spine. Abdomen / Pelvis Impression: 1. Large hiatal hernia with the entire stomach above the diaphragm within a large hernia sac . 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 2. No acute findings in the abdomen pelvis. 3. Two hypodense lesions in liver cannot be characterized. 4. Severe degenerative arthropathy of the lumbar spine. Electronically Signed   By: Suzy Bouchard M.D.   On: 10/22/2017 18:20   Dg Ribs Unilateral W/chest Left  Result Date: 10/24/2017 CLINICAL DATA:  Fall, left lateral chest pain EXAM: LEFT RIBS AND CHEST - 3+ VIEW COMPARISON:  11/25/2006 FINDINGS: Cardiomegaly. Left lower lobe atelectasis. No visible left rib fracture. No effusion or pneumothorax. IMPRESSION: Cardiomegaly.  Left lower lobe atelectasis. No visible rib fracture. Electronically Signed   By: Rolm Baptise M.D.   On: 10/24/2017 10:49   Dg Tibia/fibula Left  Result Date: 10/23/2017 CLINICAL DATA:  84 year old female status post multiple falls EXAM: LEFT TIBIA AND FIBULA - 2 VIEW COMPARISON:  Concurrently obtained radiographs of the right tib-fib FINDINGS: Surgical changes of prior total knee arthroplasty without evidence of hardware complication or periprosthetic fracture. No acute fracture or malalignment identified. The bones appear osteopenic. IMPRESSION: Negative. Electronically Signed   By: Jacqulynn Cadet M.D.   On:  11/08/2017 18:30   Dg Tibia/fibula Right  Result Date: 11/02/2017 CLINICAL DATA:  84 year old female status post fall EXAM: RIGHT TIBIA AND FIBULA - 2 VIEW COMPARISON:  Concurrently obtained radiographs of the left tib-fib FINDINGS: Surgical changes of prior total knee arthroplasty without evidence of hardware complication. No acute fracture or malalignment. The bones appear osteopenic. Soft tissues are unremarkable. IMPRESSION: Negative. Electronically Signed   By: Jacqulynn Cadet M.D.   On: 11/12/2017 18:30   Ct Head Wo Contrast  Result Date: 10/21/2017 CLINICAL DATA:  Altered mental status.  Multiple falls. EXAM: CT HEAD WITHOUT CONTRAST CT CERVICAL SPINE WITHOUT CONTRAST TECHNIQUE: Multidetector CT imaging of the head and cervical spine was performed following the standard protocol without intravenous contrast. Multiplanar CT image reconstructions of the cervical spine were also generated. COMPARISON:  None. FINDINGS: CT HEAD FINDINGS Brain: No evidence of acute infarction, hemorrhage, hydrocephalus, extra-axial collection or mass lesion/mass effect. Marked brain parenchymal atrophy and white matter microangiopathy. Lacunar infarct in bilateral basal ganglia. Vascular: Calcific atherosclerotic disease at the skull base. Skull: Normal. Negative for fracture or focal lesion. Sinuses/Orbits: No acute finding. Other: Left frontal scalp hematoma. CT CERVICAL SPINE FINDINGS Alignment: 3 mm posterior listhesis of T1 vertebral body. Skull base and vertebrae: Compression deformity of T1 vertebral body, which is poorly visualized due to motion artifact, however suspicious for acute fracture. Soft tissues and spinal canal: No prevertebral fluid or swelling. No visible canal hematoma. Disc levels: Advanced multilevel osteoarthritic changes of the cervical spine. Upper chest: Large left pleural effusion. Other: None. IMPRESSION: No acute intracranial abnormality. Advanced brain parenchymal atrophy, chronic  microvascular disease, chronic bilateral basal ganglia lacunar infarcts. Suspected acute fracture of T1 vertebral body, poorly visualized due to motion artifact, with approximate 30% height loss. 3 mm posterior listhesis of T1 on C7. Multilevel osteoarthritic changes of the cervical spine. Large left pleural effusion. These results were called by telephone at the time of interpretation on 10/18/2017 at 6:22 pm to Dr. Charlesetta Shanks , who verbally acknowledged these results. Electronically Signed   By: Fidela Salisbury M.D.   On: 10/28/2017 18:23   Ct Head Wo Contrast  Result Date: 10/24/2017 CLINICAL DATA:  Bilateral periorbital bruising following a fall last night and 3 nights ago. On Coumadin. EXAM: CT HEAD WITHOUT CONTRAST CT MAXILLOFACIAL WITHOUT CONTRAST CT CERVICAL SPINE WITHOUT CONTRAST TECHNIQUE: Multidetector CT imaging of the head, cervical spine, and maxillofacial structures were performed using the standard protocol without intravenous contrast. Multiplanar CT image reconstructions of the cervical spine and maxillofacial structures were also generated. COMPARISON:  None. FINDINGS: CT HEAD FINDINGS Brain: Diffusely enlarged ventricles and subarachnoid spaces. Patchy white matter low density in both cerebral hemispheres. Old bilateral basal ganglia lacunar infarcts. No intracranial hemorrhage, mass lesion or CT evidence of acute infarction. Vascular: No hyperdense vessel or unexpected calcification. Skull: Mildly limited due to motion degradation. Normal. Negative for fracture or focal lesion. Other: Midline and left frontal scalp hematoma. CT MAXILLOFACIAL FINDINGS Osseous: Nondisplaced fracture of the distal tip of the nasal bone. Mild motion degradation with no additional fractures visualized. The anterior maxillary spine is intact. Orbits: Status post bilateral cataract extraction. Otherwise, normal appearing orbital contents. Sinuses: Normally pneumatized. Soft tissues: Bilateral periorbital  soft tissue hematomas. CT CERVICAL SPINE FINDINGS Alignment: Grade 1 anterolisthesis at the C7-T1 level. Skull base and vertebrae: Mildly limited due to motion degradation. No acute fracture. No primary bone lesion or focal pathologic process. Soft tissues and spinal canal: No prevertebral fluid or swelling. No visible canal hematoma. Disc levels: Multilevel degenerative changes, including facet degenerative changes at multiple levels.  Upper chest: Mild biapical pleural and parenchymal scarring. Other: Enlarged right lobe of the thyroid gland containing multiple nodules measuring up to 2.1 cm in maximum diameter each. Surgically absent left lobe of the thyroid gland. IMPRESSION: 1. The examinations are mildly limited by patient motion. 2. No skull fracture or intracranial hemorrhage. 3. Nondisplaced fracture of the tip of the nasal bone. 4. No cervical spine fracture or traumatic subluxation. 5. Bilateral periorbital soft tissue hematomas and midline and left frontal scalp hematomas. 6. Mild to moderate diffuse cerebral and cerebellar atrophy. 7. Extensive chronic small vessel white matter ischemic changes in both cerebral hemispheres. 8. Old bilateral basal ganglia lacunar infarcts. 9. Multilevel cervical spine degenerative changes with associated grade 1 anterolisthesis at the C7-T1 level. 10. Multinodular thyroid goiter on the right with the largest nodule measuring 2.1 cm. Recommend further evaluation with thyroid ultrasound. If patient is clinically hyperthyroid, consider nuclear medicine thyroid uptake and scan. Electronically Signed   By: Claudie Revering M.D.   On: 10/24/2017 11:38   Ct Chest Wo Contrast  Result Date: 10/30/2017 CLINICAL DATA:  Altered mental status, falls, uncooperative patient. EXAM: CT CHEST, ABDOMEN AND PELVIS WITHOUT CONTRAST TECHNIQUE: Multidetector CT imaging of the chest, abdomen and pelvis was performed following the standard protocol without IV contrast. COMPARISON:  Chest  radiograph 10/24/2017, 10/26/2017, head 120 19 FINDINGS: CT CHEST FINDINGS Cardiovascular: Coronary artery calcification and aortic atherosclerotic calcification. No pericardial effusion. Mediastinum/Nodes: No axillary or supraclavicular adenopathy. No mediastinal hilar adenopathy. Lungs/Pleura: Bilateral moderate pleural effusions. There is linear bands of atelectasis and potential loculated effusion within the lingula. No clear mass lesion. Ground-glass nodule in the RIGHT lower lobe measures 11 mm (image 23, series 3 Musculoskeletal: No evidence of rib fracture. Osteopenia. Severe degenerate changes shoulders. Chronic appearing compression fracture at T2 and T5 CT ABDOMEN AND PELVIS FINDINGS Hepatobiliary: 2 low-density lesions in the liver cannot be characterized without IV contrast. Central RIGHT hepatic lobe measures 12 mm and and LEFT lateral hepatic lobe measuring 9 mm both on image 50, series 3. Gallbladder normal. Pancreas: Pancreas is normal. No ductal dilatation. No pancreatic inflammation. Spleen: Normal spleen Adrenals/urinary tract: Adrenal glands and kidneys are normal. The ureters and bladder normal. Stomach/Bowel: The entire stomach is contained within a large hiatal hernia which extends into the posterior mediastinum behind the heart and into the LEFT hemithorax. The stomach is inverted such that the cardia is below the antrum (organoaxial volvulus). No evidence of high-grade obstruction. Duodenum small bowel are normal. Colon rectosigmoid colon normal. Vascular/Lymphatic: Abdominal aorta is normal caliber. There is no retroperitoneal or periportal lymphadenopathy. No pelvic lymphadenopathy. Reproductive: Post hysterectomy Other: No free fluid. Musculoskeletal: LEFT hip prosthetic. Severe osteopenia of the bones. Bulky osteophytosis IMPRESSION: Chest Impression: 1. Bilateral pleural effusions. 2. Atelectasis and loculated effusion in the LEFT lung in part related to the large hiatal hernia. No  acute findings or malignancy identified. 3. Ground-glass nodule in the RIGHT lower lobe may represent small focus of infection. Low-grade neoplasm cannot be excluded. 4. Chronic compression fracture the upper thoracic spine. Abdomen / Pelvis Impression: 1. Large hiatal hernia with the entire stomach above the diaphragm within a large hernia sac . 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 2. No acute findings in the abdomen pelvis. 3. Two hypodense lesions in liver cannot be characterized. 4. Severe degenerative arthropathy of the lumbar spine. Electronically Signed   By: Suzy Bouchard M.D.   On: 11/10/2017  18:20   Ct Cervical Spine Wo Contrast  Result Date: 10/17/2017 CLINICAL DATA:  Altered mental status.  Multiple falls. EXAM: CT HEAD WITHOUT CONTRAST CT CERVICAL SPINE WITHOUT CONTRAST TECHNIQUE: Multidetector CT imaging of the head and cervical spine was performed following the standard protocol without intravenous contrast. Multiplanar CT image reconstructions of the cervical spine were also generated. COMPARISON:  None. FINDINGS: CT HEAD FINDINGS Brain: No evidence of acute infarction, hemorrhage, hydrocephalus, extra-axial collection or mass lesion/mass effect. Marked brain parenchymal atrophy and white matter microangiopathy. Lacunar infarct in bilateral basal ganglia. Vascular: Calcific atherosclerotic disease at the skull base. Skull: Normal. Negative for fracture or focal lesion. Sinuses/Orbits: No acute finding. Other: Left frontal scalp hematoma. CT CERVICAL SPINE FINDINGS Alignment: 3 mm posterior listhesis of T1 vertebral body. Skull base and vertebrae: Compression deformity of T1 vertebral body, which is poorly visualized due to motion artifact, however suspicious for acute fracture. Soft tissues and spinal canal: No prevertebral fluid or swelling. No visible canal hematoma. Disc levels: Advanced multilevel  osteoarthritic changes of the cervical spine. Upper chest: Large left pleural effusion. Other: None. IMPRESSION: No acute intracranial abnormality. Advanced brain parenchymal atrophy, chronic microvascular disease, chronic bilateral basal ganglia lacunar infarcts. Suspected acute fracture of T1 vertebral body, poorly visualized due to motion artifact, with approximate 30% height loss. 3 mm posterior listhesis of T1 on C7. Multilevel osteoarthritic changes of the cervical spine. Large left pleural effusion. These results were called by telephone at the time of interpretation on 10/20/2017 at 6:22 pm to Dr. Charlesetta Shanks , who verbally acknowledged these results. Electronically Signed   By: Fidela Salisbury M.D.   On: 11/12/2017 18:23   Ct Cervical Spine Wo Contrast  Result Date: 10/24/2017 CLINICAL DATA:  Bilateral periorbital bruising following a fall last night and 3 nights ago. On Coumadin. EXAM: CT HEAD WITHOUT CONTRAST CT MAXILLOFACIAL WITHOUT CONTRAST CT CERVICAL SPINE WITHOUT CONTRAST TECHNIQUE: Multidetector CT imaging of the head, cervical spine, and maxillofacial structures were performed using the standard protocol without intravenous contrast. Multiplanar CT image reconstructions of the cervical spine and maxillofacial structures were also generated. COMPARISON:  None. FINDINGS: CT HEAD FINDINGS Brain: Diffusely enlarged ventricles and subarachnoid spaces. Patchy white matter low density in both cerebral hemispheres. Old bilateral basal ganglia lacunar infarcts. No intracranial hemorrhage, mass lesion or CT evidence of acute infarction. Vascular: No hyperdense vessel or unexpected calcification. Skull: Mildly limited due to motion degradation. Normal. Negative for fracture or focal lesion. Other: Midline and left frontal scalp hematoma. CT MAXILLOFACIAL FINDINGS Osseous: Nondisplaced fracture of the distal tip of the nasal bone. Mild motion degradation with no additional fractures visualized. The  anterior maxillary spine is intact. Orbits: Status post bilateral cataract extraction. Otherwise, normal appearing orbital contents. Sinuses: Normally pneumatized. Soft tissues: Bilateral periorbital soft tissue hematomas. CT CERVICAL SPINE FINDINGS Alignment: Grade 1 anterolisthesis at the C7-T1 level. Skull base and vertebrae: Mildly limited due to motion degradation. No acute fracture. No primary bone lesion or focal pathologic process. Soft tissues and spinal canal: No prevertebral fluid or swelling. No visible canal hematoma. Disc levels: Multilevel degenerative changes, including facet degenerative changes at multiple levels. Upper chest: Mild biapical pleural and parenchymal scarring. Other: Enlarged right lobe of the thyroid gland containing multiple nodules measuring up to 2.1 cm in maximum diameter each. Surgically absent left lobe of the thyroid gland. IMPRESSION: 1. The examinations are mildly limited by patient motion. 2. No skull fracture or intracranial hemorrhage. 3. Nondisplaced fracture of the tip of the  nasal bone. 4. No cervical spine fracture or traumatic subluxation. 5. Bilateral periorbital soft tissue hematomas and midline and left frontal scalp hematomas. 6. Mild to moderate diffuse cerebral and cerebellar atrophy. 7. Extensive chronic small vessel white matter ischemic changes in both cerebral hemispheres. 8. Old bilateral basal ganglia lacunar infarcts. 9. Multilevel cervical spine degenerative changes with associated grade 1 anterolisthesis at the C7-T1 level. 10. Multinodular thyroid goiter on the right with the largest nodule measuring 2.1 cm. Recommend further evaluation with thyroid ultrasound. If patient is clinically hyperthyroid, consider nuclear medicine thyroid uptake and scan. Electronically Signed   By: Claudie Revering M.D.   On: 10/24/2017 11:38   Dg Chest Portable 1 View  Result Date: 10/13/2017 CLINICAL DATA:  Increased altered mental status. EXAM: PORTABLE CHEST 1 VIEW  COMPARISON:  10/24/2017 FINDINGS: Enlarged cardiac silhouette. Calcific atherosclerotic disease of the aorta. Mediastinal contours appear intact. There is no evidence of focal pneumothorax. Interval development of left pleural effusion and left lower lobe airspace consolidation versus atelectasis. Osseous structures are without acute abnormality. Soft tissues are grossly normal. IMPRESSION: Interval development of left pleural effusion and left lower lobe atelectasis versus airspace consolidation. Enlarged cardiac silhouette. Calcific atherosclerotic disease of the aorta. Electronically Signed   By: Fidela Salisbury M.D.   On: 11/02/2017 16:21   Ct Maxillofacial Wo Contrast  Result Date: 10/24/2017 CLINICAL DATA:  Bilateral periorbital bruising following a fall last night and 3 nights ago. On Coumadin. EXAM: CT HEAD WITHOUT CONTRAST CT MAXILLOFACIAL WITHOUT CONTRAST CT CERVICAL SPINE WITHOUT CONTRAST TECHNIQUE: Multidetector CT imaging of the head, cervical spine, and maxillofacial structures were performed using the standard protocol without intravenous contrast. Multiplanar CT image reconstructions of the cervical spine and maxillofacial structures were also generated. COMPARISON:  None. FINDINGS: CT HEAD FINDINGS Brain: Diffusely enlarged ventricles and subarachnoid spaces. Patchy white matter low density in both cerebral hemispheres. Old bilateral basal ganglia lacunar infarcts. No intracranial hemorrhage, mass lesion or CT evidence of acute infarction. Vascular: No hyperdense vessel or unexpected calcification. Skull: Mildly limited due to motion degradation. Normal. Negative for fracture or focal lesion. Other: Midline and left frontal scalp hematoma. CT MAXILLOFACIAL FINDINGS Osseous: Nondisplaced fracture of the distal tip of the nasal bone. Mild motion degradation with no additional fractures visualized. The anterior maxillary spine is intact. Orbits: Status post bilateral cataract extraction.  Otherwise, normal appearing orbital contents. Sinuses: Normally pneumatized. Soft tissues: Bilateral periorbital soft tissue hematomas. CT CERVICAL SPINE FINDINGS Alignment: Grade 1 anterolisthesis at the C7-T1 level. Skull base and vertebrae: Mildly limited due to motion degradation. No acute fracture. No primary bone lesion or focal pathologic process. Soft tissues and spinal canal: No prevertebral fluid or swelling. No visible canal hematoma. Disc levels: Multilevel degenerative changes, including facet degenerative changes at multiple levels. Upper chest: Mild biapical pleural and parenchymal scarring. Other: Enlarged right lobe of the thyroid gland containing multiple nodules measuring up to 2.1 cm in maximum diameter each. Surgically absent left lobe of the thyroid gland. IMPRESSION: 1. The examinations are mildly limited by patient motion. 2. No skull fracture or intracranial hemorrhage. 3. Nondisplaced fracture of the tip of the nasal bone. 4. No cervical spine fracture or traumatic subluxation. 5. Bilateral periorbital soft tissue hematomas and midline and left frontal scalp hematomas. 6. Mild to moderate diffuse cerebral and cerebellar atrophy. 7. Extensive chronic small vessel white matter ischemic changes in both cerebral hemispheres. 8. Old bilateral basal ganglia lacunar infarcts. 9. Multilevel cervical spine degenerative changes with associated grade 1  anterolisthesis at the C7-T1 level. 10. Multinodular thyroid goiter on the right with the largest nodule measuring 2.1 cm. Recommend further evaluation with thyroid ultrasound. If patient is clinically hyperthyroid, consider nuclear medicine thyroid uptake and scan. Electronically Signed   By: Claudie Revering M.D.   On: 10/24/2017 11:38    Microbiology Recent Results (from the past 240 hour(s))  Blood culture (routine x 2)     Status: None   Collection Time: 10/22/2017  3:45 PM  Result Value Ref Range Status   Specimen Description BLOOD BLOOD LEFT  HAND  Final   Special Requests   Final    BOTTLES DRAWN AEROBIC AND ANAEROBIC Blood Culture adequate volume   Culture NO GROWTH 5 DAYS  Final   Report Status 11/06/2017 FINAL  Final  Urine culture     Status: None   Collection Time: 11/02/2017  4:25 PM  Result Value Ref Range Status   Specimen Description URINE, RANDOM  Final   Special Requests NONE  Final   Culture NO GROWTH  Final   Report Status 11/02/2017 FINAL  Final  Blood culture (routine x 2)     Status: None   Collection Time: 10/25/2017  6:25 PM  Result Value Ref Range Status   Specimen Description BLOOD BLOOD RIGHT HAND  Final   Special Requests IN PEDIATRIC BOTTLE Blood Culture adequate volume  Final   Culture NO GROWTH 5 DAYS  Final   Report Status 11/06/2017 FINAL  Final    Lab Basic Metabolic Panel: No results for input(s): NA, K, CL, CO2, GLUCOSE, BUN, CREATININE, CALCIUM, MG, PHOS in the last 168 hours. Liver Function Tests: No results for input(s): AST, ALT, ALKPHOS, BILITOT, PROT, ALBUMIN in the last 168 hours. No results for input(s): LIPASE, AMYLASE in the last 168 hours. No results for input(s): AMMONIA in the last 168 hours. CBC: No results for input(s): WBC, NEUTROABS, HGB, HCT, MCV, PLT in the last 168 hours. Cardiac Enzymes: No results for input(s): CKTOTAL, CKMB, CKMBINDEX, TROPONINI in the last 168 hours. Sepsis Labs: No results for input(s): PROCALCITON, WBC, LATICACIDVEN in the last 168 hours.  Procedures/Operations  None   Idelle Reimann 11/11/2017, 12:46 PM

## 2017-11-13 NOTE — Progress Notes (Signed)
Patient brady down to the 30's on the monitor. Two RNs auscultated all quadrants for one minute, no breath sounds, no pulse noted. Death was called at 01:52 am. Family member, On call physician, [place] donor called with referral number [phone], medical examiner were all notified. Family members are now here with emotional support offered. Post mortem care completed.

## 2017-11-13 DEATH — deceased

## 2019-07-21 IMAGING — DX DG RIBS W/ CHEST 3+V*L*
5 series · 5 of 5 positions shown · non-contrast
Comparison: 11/25/2006

CLINICAL DATA: Fall, left lateral chest pain

EXAM:
LEFT RIBS AND CHEST - 3+ VIEW

[chest ap]
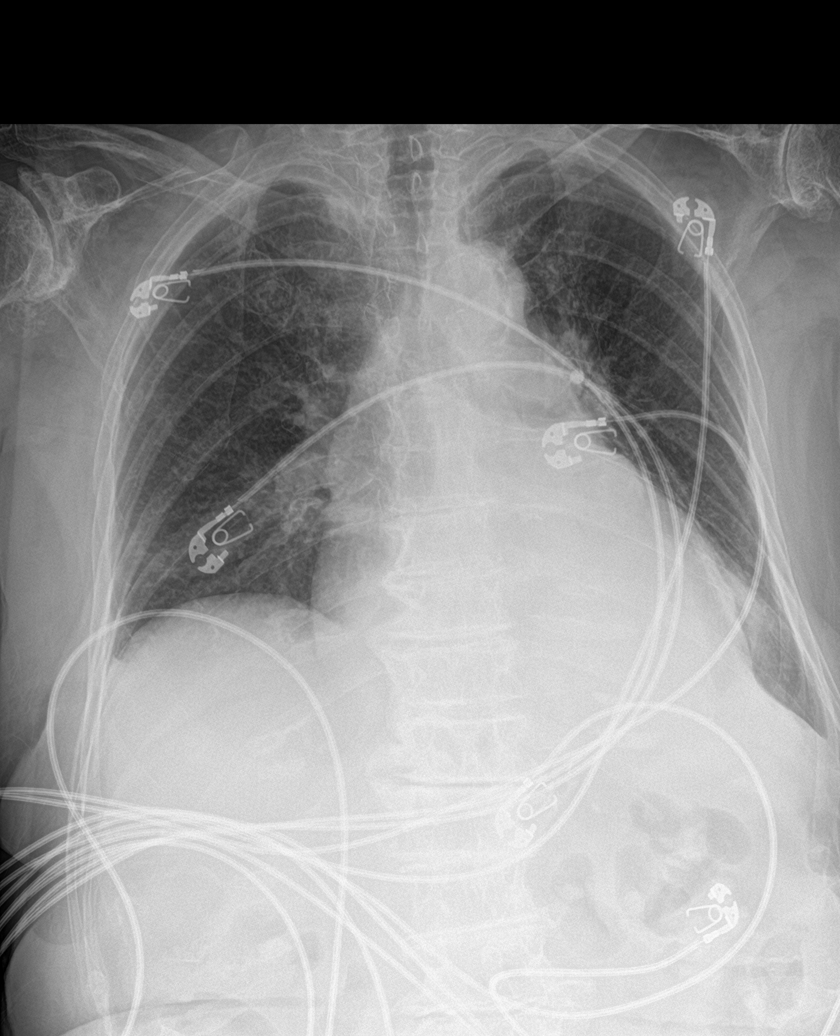

[rib ap (1 of 2)]
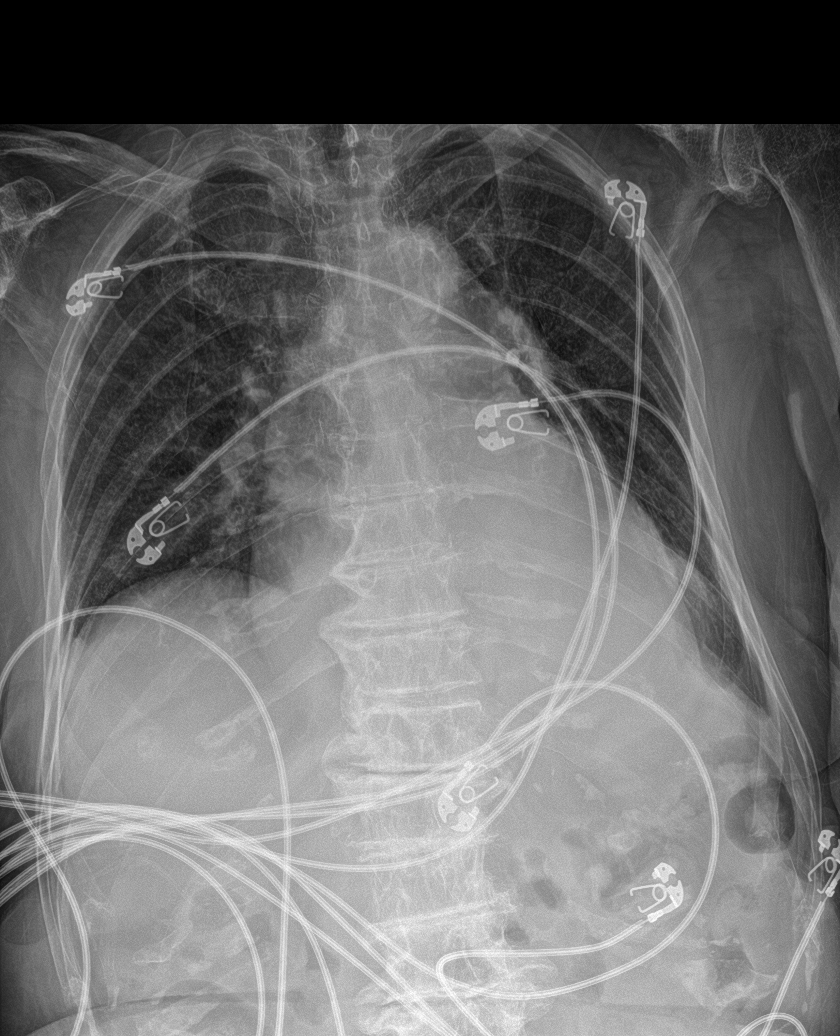

[rib ap obl (1 of 2)]
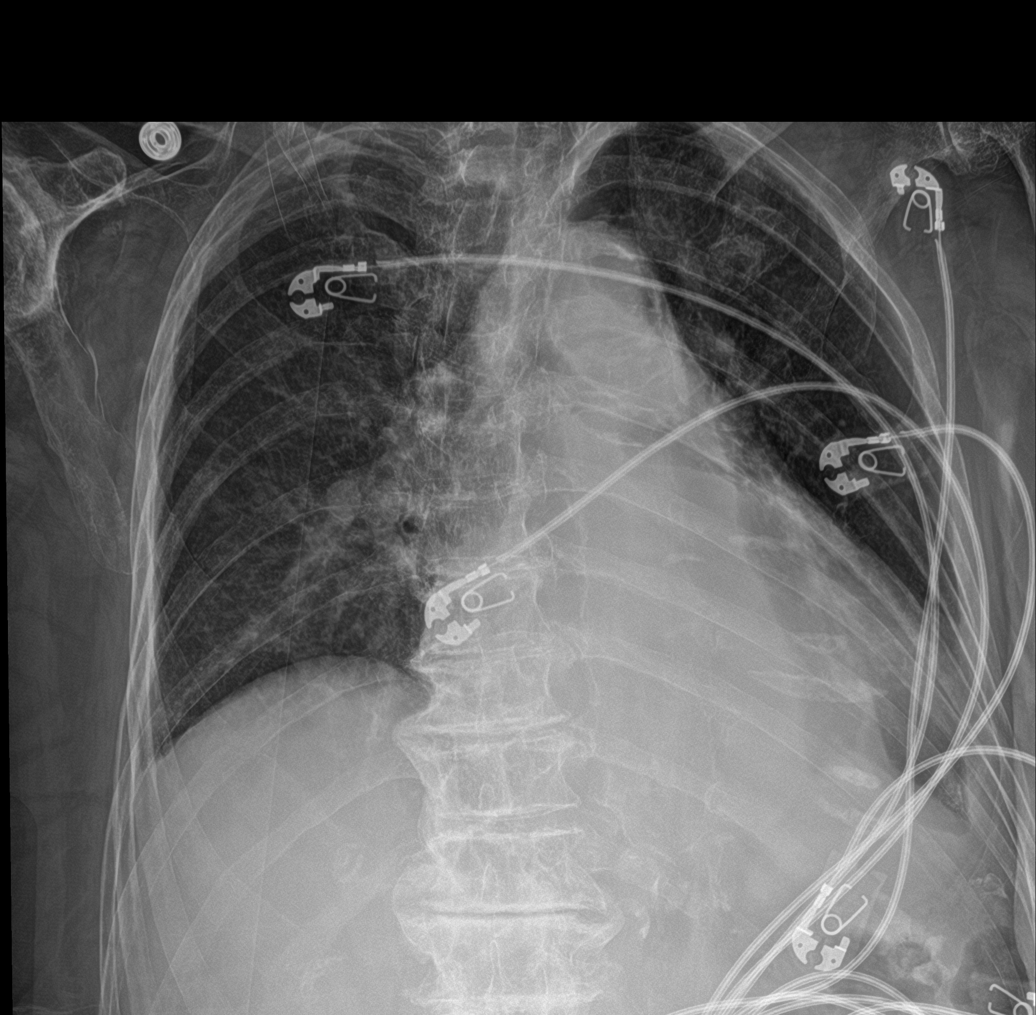

[rib ap (2 of 2)]
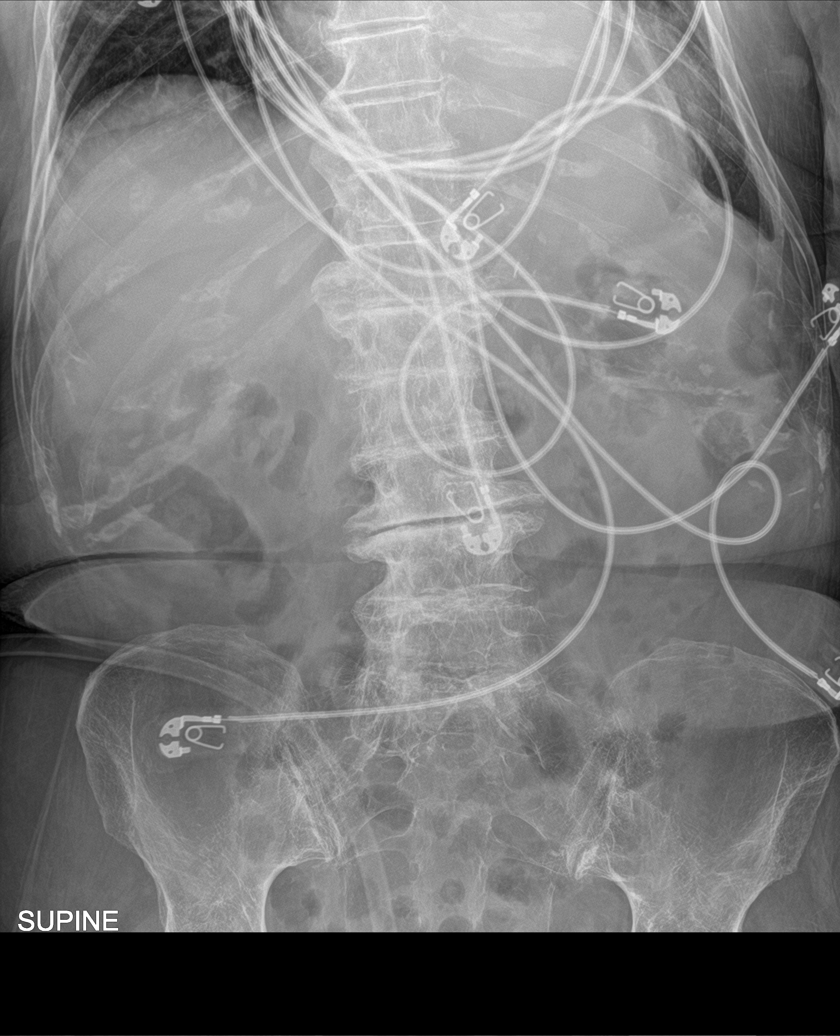

[rib ap obl (2 of 2)]
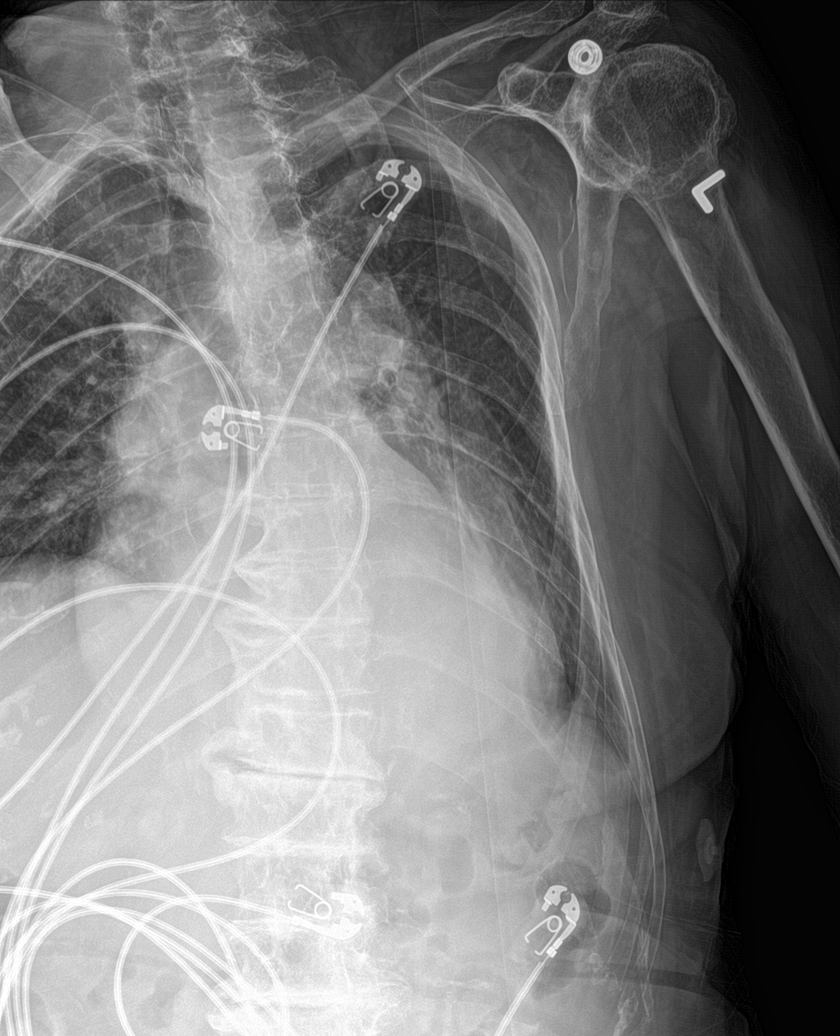

[5 of 5 positions shown; findings below may reference images not displayed]

FINDINGS: Cardiomegaly. Left lower lobe atelectasis. No visible left rib
fracture. No effusion or pneumothorax.
IMPRESSION: Cardiomegaly.  Left lower lobe atelectasis.

No visible rib fracture.

## 2019-07-21 IMAGING — CT CT MAXILLOFACIAL W/O CM
3 of 4 series · 14 of 47 positions shown, 16 images · non-contrast
Comparison: None.

CLINICAL DATA: Bilateral periorbital bruising following a fall last
night and 3 nights ago. On Coumadin.

EXAM:
CT HEAD WITHOUT CONTRAST
CT MAXILLOFACIAL WITHOUT CONTRAST
CT CERVICAL SPINE WITHOUT CONTRAST
TECHNIQUE: Multidetector CT imaging of the head, cervical spine, and
maxillofacial structures were performed using the standard protocol
without intravenous contrast. Multiplanar CT image reconstructions
of the cervical spine and maxillofacial structures were also
generated.

[Series 4: head bone · axial · 0.40mm/px · z∈[-104,+24]mm · 8 of 80 slices shown, 10 images]
[im 8/80  brain]
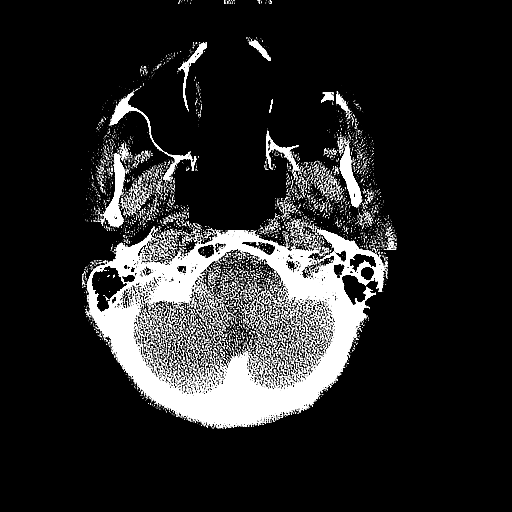
[im 8/80  bone]
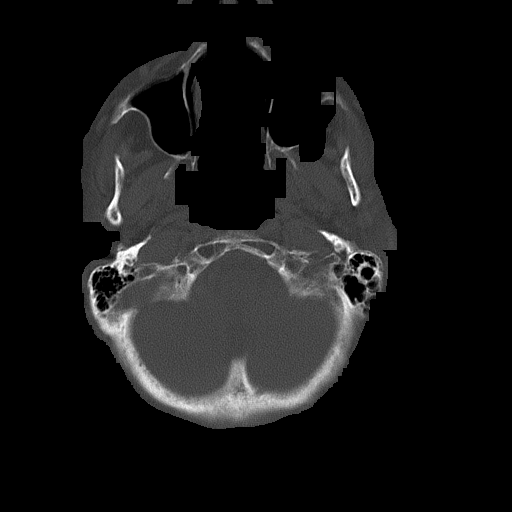
[im 16/80  bone]
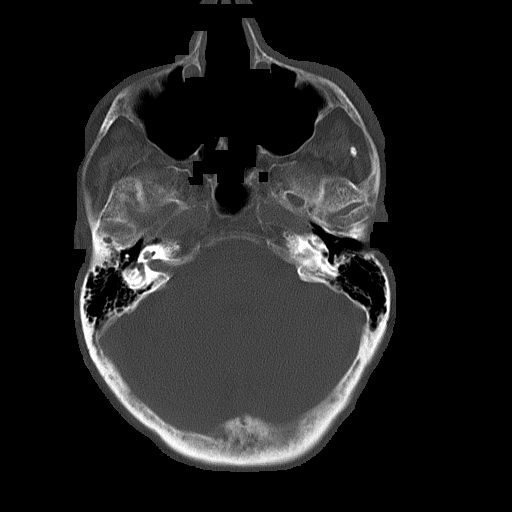
[im 24/80  bone]
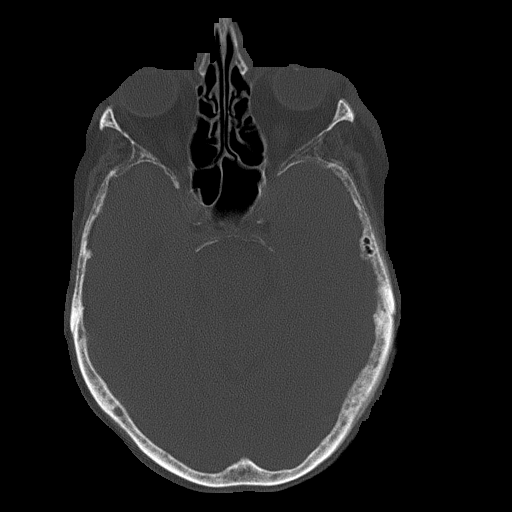
[im 36/80  bone]
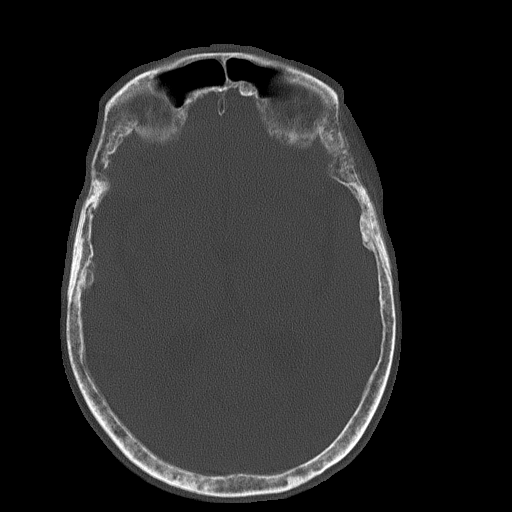
[im 44/80  brain]
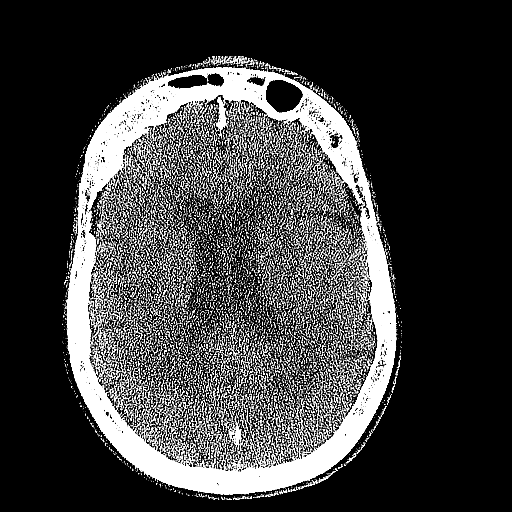
[im 44/80  bone]
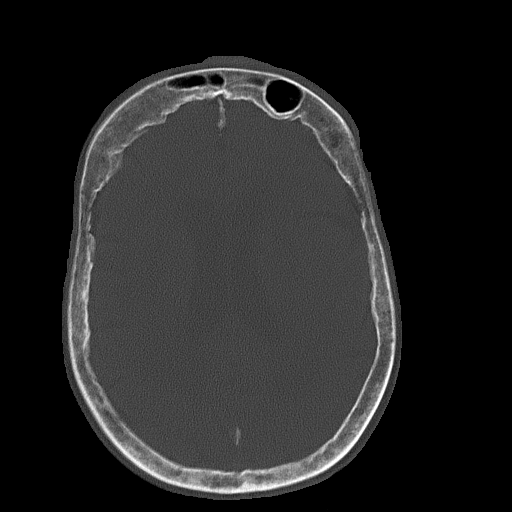
[im 56/80  bone]
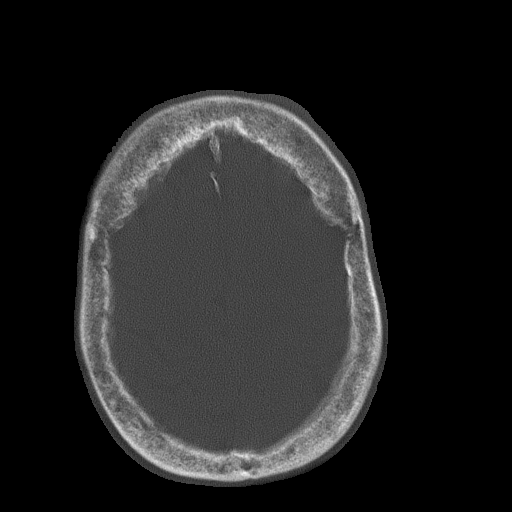
[im 64/80  bone]
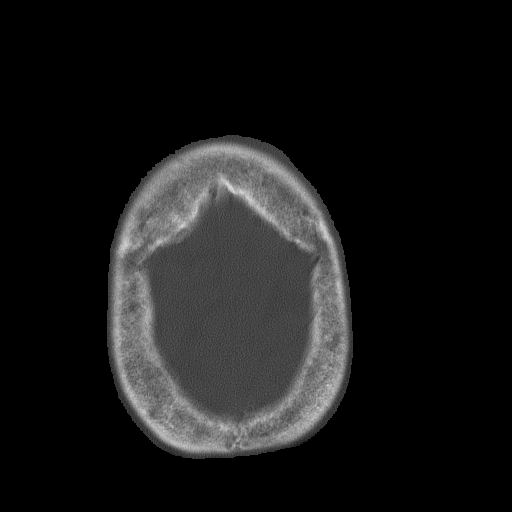
[im 72/80  bone]
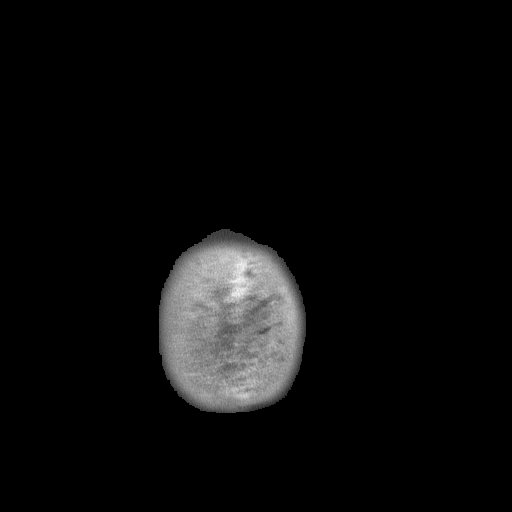

[Series 5: head without cor · coronal · non-contrast · 0.28mm/px · 3 of 67 slices shown]
[im 23/67  bone]
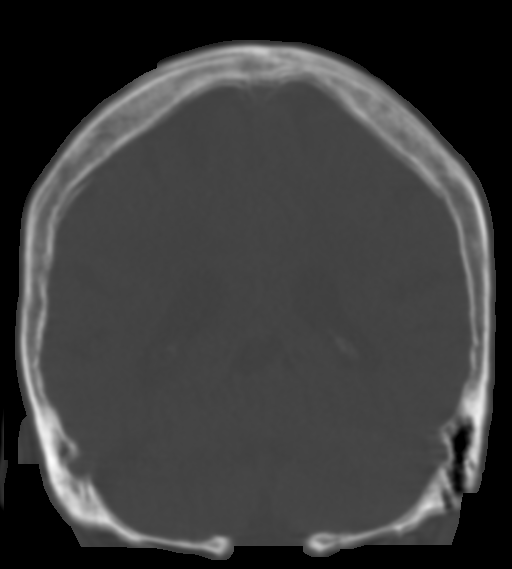
[im 30/67  bone]
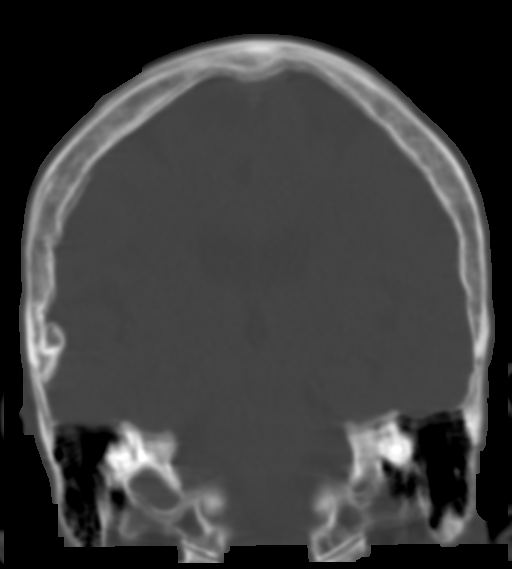
[im 37/67  bone]
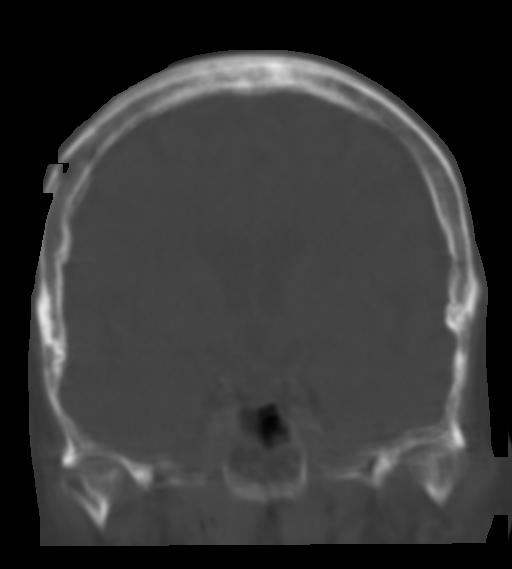

[Series 6: head without sag · sagittal · non-contrast · 0.31mm/px · 3 of 62 slices shown]
[im 21/62  bone]
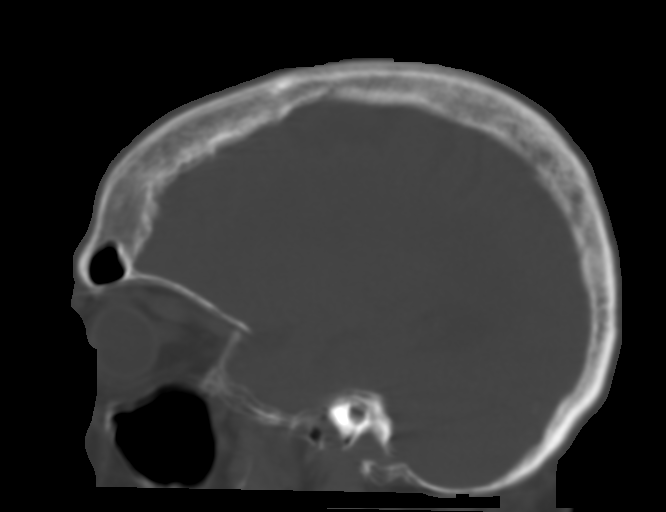
[im 31/62  bone]
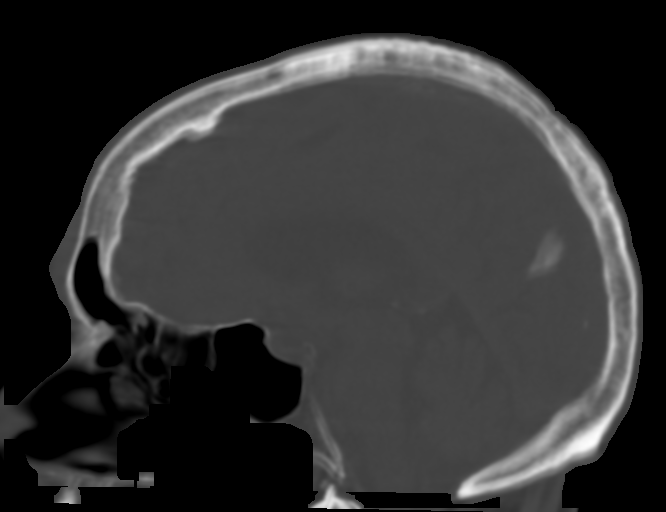
[im 41/62  bone]
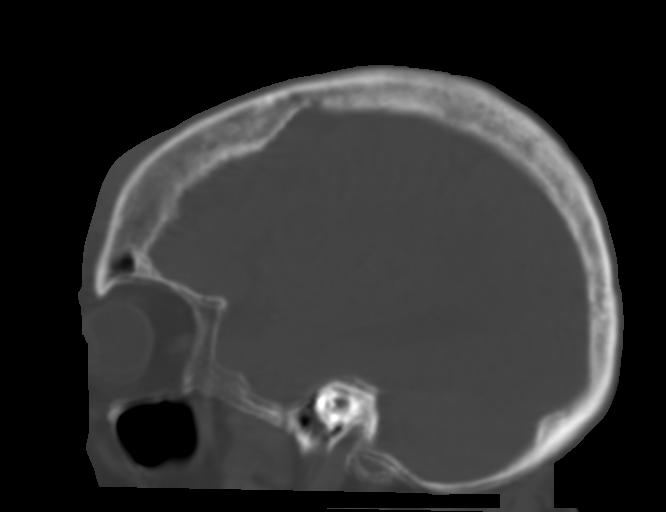

[14 of 47 positions shown; findings below may reference images not displayed]

FINDINGS: CT HEAD FINDINGS

Brain: Diffusely enlarged ventricles and subarachnoid spaces. Patchy
white matter low density in both cerebral hemispheres. Old bilateral
basal ganglia lacunar infarcts. No intracranial hemorrhage, mass
lesion or CT evidence of acute infarction.

Vascular: No hyperdense vessel or unexpected calcification.

Skull: Mildly limited due to motion degradation. Normal. Negative
for fracture or focal lesion.

Other: Midline and left frontal scalp hematoma.

CT MAXILLOFACIAL FINDINGS

Osseous: Nondisplaced fracture of the distal tip of the nasal bone.
Mild motion degradation with no additional fractures visualized. The
anterior maxillary spine is intact.

Orbits: Status post bilateral cataract extraction. Otherwise, normal
appearing orbital contents.

Sinuses: Normally pneumatized.

Soft tissues: Bilateral periorbital soft tissue hematomas.

CT CERVICAL SPINE FINDINGS

Alignment: Grade 1 anterolisthesis at the C7-T1 level.

Skull base and vertebrae: Mildly limited due to motion degradation.
No acute fracture. No primary bone lesion or focal pathologic
process.

Soft tissues and spinal canal: No prevertebral fluid or swelling. No
visible canal hematoma.

Disc levels: Multilevel degenerative changes, including facet
degenerative changes at multiple levels.

Upper chest: Mild biapical pleural and parenchymal scarring.

Other: Enlarged right lobe of the thyroid gland containing multiple
nodules measuring up to 2.1 cm in maximum diameter each. Surgically
absent left lobe of the thyroid gland.
IMPRESSION: 1. The examinations are mildly limited by patient motion.
2. No skull fracture or intracranial hemorrhage.
3. Nondisplaced fracture of the tip of the nasal bone.
4. No cervical spine fracture or traumatic subluxation.
5. Bilateral periorbital soft tissue hematomas and midline and left
frontal scalp hematomas.
6. Mild to moderate diffuse cerebral and cerebellar atrophy.
7. Extensive chronic small vessel white matter ischemic changes in
both cerebral hemispheres.
8. Old bilateral basal ganglia lacunar infarcts.
9. Multilevel cervical spine degenerative changes with associated
grade 1 anterolisthesis at the C7-T1 level.
10. Multinodular thyroid goiter on the right with the largest nodule
measuring 2.1 cm. Recommend further evaluation with thyroid
ultrasound. If patient is clinically hyperthyroid, consider nuclear
medicine thyroid uptake and scan.
